# Patient Record
Sex: Female | Born: 1995 | Race: White | Hispanic: No | Smoking: Never smoker
Health system: Southern US, Community
[De-identification: ages and names within clinical notes are randomized; demographics above are authoritative.]

## PROBLEM LIST (undated history)

## (undated) DIAGNOSIS — T7840XA Allergy, unspecified, initial encounter: Secondary | ICD-10-CM

## (undated) DIAGNOSIS — G44209 Tension-type headache, unspecified, not intractable: Secondary | ICD-10-CM

## (undated) DIAGNOSIS — F429 Obsessive-compulsive disorder, unspecified: Secondary | ICD-10-CM

## (undated) DIAGNOSIS — E669 Obesity, unspecified: Secondary | ICD-10-CM

## (undated) DIAGNOSIS — F329 Major depressive disorder, single episode, unspecified: Secondary | ICD-10-CM

## (undated) DIAGNOSIS — F32A Depression, unspecified: Secondary | ICD-10-CM

## (undated) DIAGNOSIS — R569 Unspecified convulsions: Secondary | ICD-10-CM

## (undated) DIAGNOSIS — K219 Gastro-esophageal reflux disease without esophagitis: Secondary | ICD-10-CM

## (undated) DIAGNOSIS — G43009 Migraine without aura, not intractable, without status migrainosus: Secondary | ICD-10-CM

## (undated) HISTORY — DX: Unspecified convulsions: R56.9

## (undated) HISTORY — DX: Depression, unspecified: F32.A

## (undated) HISTORY — DX: Allergy, unspecified, initial encounter: T78.40XA

## (undated) HISTORY — DX: Major depressive disorder, single episode, unspecified: F32.9

## (undated) HISTORY — DX: Migraine without aura, not intractable, without status migrainosus: G43.009

## (undated) HISTORY — DX: Tension-type headache, unspecified, not intractable: G44.209

## (undated) HISTORY — DX: Obesity, unspecified: E66.9

## (undated) HISTORY — DX: Obsessive-compulsive disorder, unspecified: F42.9

## (undated) HISTORY — PX: WISDOM TOOTH EXTRACTION: SHX21

## (undated) HISTORY — DX: Gastro-esophageal reflux disease without esophagitis: K21.9

## (undated) HISTORY — PX: NO PAST SURGERIES: SHX2092

---
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
## 2017-03-03 ENCOUNTER — Other Ambulatory Visit: Payer: Self-pay | Admitting: Family

## 2017-03-04 NOTE — Telephone Encounter (Signed)
Called and LM for patient to call back and schedule an appointment.

## 2017-04-20 ENCOUNTER — Ambulatory Visit (INDEPENDENT_AMBULATORY_CARE_PROVIDER_SITE_OTHER): Payer: 59 | Admitting: Pediatrics

## 2017-04-20 ENCOUNTER — Encounter (INDEPENDENT_AMBULATORY_CARE_PROVIDER_SITE_OTHER): Payer: Self-pay | Admitting: Pediatrics

## 2017-04-20 ENCOUNTER — Telehealth (INDEPENDENT_AMBULATORY_CARE_PROVIDER_SITE_OTHER): Payer: Self-pay | Admitting: Pediatrics

## 2017-04-20 VITALS — BP 106/78 | HR 60 | Ht 64.5 in | Wt 154.6 lb

## 2017-04-20 DIAGNOSIS — G43009 Migraine without aura, not intractable, without status migrainosus: Secondary | ICD-10-CM | POA: Diagnosis not present

## 2017-04-20 DIAGNOSIS — G478 Other sleep disorders: Secondary | ICD-10-CM | POA: Diagnosis not present

## 2017-04-20 DIAGNOSIS — G44219 Episodic tension-type headache, not intractable: Secondary | ICD-10-CM

## 2017-04-20 MED ORDER — QUDEXY XR 100 MG PO CS24: 1.0000 | EXTENDED_RELEASE_CAPSULE | Freq: Every day | ORAL | 5 refills | Status: DC

## 2017-04-20 NOTE — Telephone Encounter (Signed)
I spoke with mom for about 5 minutes. I explained that reason why I thought asleep study would be in order.  It is clear that Katherine Wagner has poor sleep hygiene, but it appears that she there may be other reasons why she is tired and is taking naps all day.  Sleep study may help us understand that.  She requested that I ordered the study.  We will contact her after prior authorization has been made.

## 2017-04-20 NOTE — Progress Notes (Signed)
Patient: Katherine Wagner MRN: [id number] Sex: female DOB: [birth year]  Provider: Ellison Carwin, MD Location of Care: [facility]  Note type: Routine return visit  History of Present Illness: Referral Source: Porfirio Oar, MD History from: sibling, patient and [facility] chart Chief Complaint: Migraines  Katherine Wagner is a 21 y.o. female who returns on April 20, 2017, for the first time since August 10, 2016.  She has migraine without aura and tension-type headaches.  In the past, she had localization-related seizures.  Although Katherine Wagner has headaches every other day, she has only had one migraine since last visit.  She has not kept a headache calendar.  She tells me, however, that despite not having severe headaches, she lies down to sleep every other day in the afternoon.  She goes to bed between 10 p.m. and 3 a.m. and gets up between 8 a.m. and 12 noon.  She says that she does not sleep well and wondered if she had problems with breathing.  She also complains that she cannot hear well out of her right ear.  This has been a relatively recent problem.  Headaches can come on at anytime during the day, but typically occur later in the day, although she does awaken on occasion with headaches.  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  She has graduated from high school.  She is not certain what she wants to do and therefore has delayed the decision to start [employer].  With her one severe headache, she had nausea and vomiting, but this does not happen with the majority of her headaches.  She says that there are times that she has dreams about headaches and will wake up with one.  This does not happen very often.  Review of Systems: 12 system review was remarkable for can not hear out of right ear, one migraine since last visit, vomiting, nausea, headaches every other day; the remainder was assessed and was negative  Past Medical  History Diagnosis Date  . Migraine headache without aura    Dr. Sharene Skeans  . Obesity   . OCD (obsessive compulsive disorder)   . Seizures (HCC)   . Tension headache    episodic   Hospitalizations: No., Head Injury: No., Nervous System Infections: No., Immunizations up to date: Yes.    Patient fell hitting her head on a table at the age of 5 stitches were needed to close the wound.   Feb 07, 2012: the patient had a normal CBC and capillary glucose. She had a normal comprehensive metabolic panel and a TSH of 1.774. Her H. pylori IgG was negative.  I reviewed a CT scan of the brain without contrast on April 03, 2004, that was normal and lumbar spine films on Feb 05, 2012, that were also normal.   She had seizures that were controlled from August 2007 that were associated with drawing of her mouth and drooling, as well as unresponsive staring. 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  November 15, 2007: seizures that were under control on Trileptal, periodic headaches that did not interfere with school but did last hours and sometimes forced her to the emergency room, and emergence of obsessive compulsive behaviors where she obsessively wiped herself until she bled and caused dysuria. This was associated with counting behavior; as such she wiped herself, she  counted many different items she had to do homework in two separate places. Failure to allow her to perform these behaviors created emotional upset. She was placed on fluoxetine which seemed to help. At some point, Trileptal was tapered. I have no records showing that we performed an EEG before that took placed. Seizures have not recurred.  Headaches have been present since she was 5. She had a closed head injury requiring stitches in the right frontal region without loss of consciousness. Mild low back pain that is  not definitely interfering with her activities.   constipation and rosacea, eczema, gastroesophageal reflux disease  Birth History 6 lbs. 9 oz. infant born at full term to a 63 year old gravida 2 para 14 female. Gestation was uncomplicated Labor lasted for 8 hours Normal spontaneous vaginal delivery Nursery course was uncomplicated Growth and development was recalled as normal.  Behavior History none  Surgical History History reviewed. No pertinent surgical history.  Family History family history includes Migraines in her maternal grandmother and mother. Family history is negative for seizures, intellectual disabilities, blindness, deafness, birth defects, chromosomal disorder, or autism.  Social History  . Marital status: Single  . Years of education: 35   Occupational History  . Cashier [employer]   Social History Main Topics  . Smoking status: Never Smoker  . Smokeless tobacco: Never Used  . Alcohol use No  . Drug use: No  . 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    She graduated from [employer].     She lives with both parents and one sister, 44 yo.     She loves to hang out with friends, go to the beach and take selfies.   No Known Allergies  Physical Exam BP 106/78   Pulse 60   Ht 5' 4.5" (1.638 m)   Wt 154 lb 9.6 oz (70.1 kg)   BMI 26.13 kg/m   General: alert, well developed, well nourished, in no acute distress, blond hair, brown eyes, right handed Head: normocephalic, no dysmorphic features Ears, Nose and Throat: Otoscopic: tympanic membranes normal; pharynx: oropharynx is pink without exudates or tonsillar hypertrophy Neck: supple, full range of motion, no cranial or cervical bruits Respiratory: auscultation clear Cardiovascular: no murmurs, pulses are normal Musculoskeletal: no skeletal deformities or apparent scoliosis Skin: no rashes or neurocutaneous lesions  Neurologic Exam  Mental Status:  alert; oriented to person, place and year; knowledge is normal for age; language is normal Cranial Nerves: visual fields are full to double simultaneous stimuli; extraocular movements are full and conjugate; pupils are round reactive to light; funduscopic examination shows sharp disc margins with normal vessels; symmetric facial strength; midline tongue and uvula; air conduction is greater than bone conduction bilaterally Motor: Normal strength, tone and mass; good fine motor movements; no pronator drift Sensory: intact responses to cold, vibration, proprioception and stereognosis Coordination: good finger-to-nose, rapid repetitive alternating movements and finger apposition Gait and Station: normal gait and station: patient is able to walk on heels, toes and tandem without difficulty; balance is adequate; Romberg exam is negative; Gower response is negative Reflexes: symmetric and diminished bilaterally; no clonus; bilateral flexor plantar responses  Assessment 1. Migraine without aura and without status migrainosus, not intractable, G43.009. 2. Episodic tension-type headache, not intractable, G44.219. 3. Sleep arousal disorder, G47.8.  Discussion I am pleased that the migraines are infrequent.  I wish that she was keeping a headache calendar so that I would be certain that  the migraines are rare as she says they are.  Nonetheless, I feel fairly certain that if she was having severe headaches several times a week,that she would inform me.  She takes Qudexy, which has done a good job of controlling her migraines.  We had some problems with co-pays, which I think have been straightened out.  She is tolerating this medicine much better than she did to topiramate.  I asked her to discuss with her mother whether or not mother would agree with a polysomnogram or sleep study.  This will help us determine if she really was not sleeping well at nighttime related to periodic limb movements, sleep apnea, or  some other process.  We will likely be able to do this at [facility].  I think that she is sleeping during the day because she is not getting much sleep at nighttime.  I blame this more on sleep hygiene than some underlying organic sleep disorder.  I am also concerned that she has gained 21 pounds over the past 8 months.  She says that she is only eating a couple of meals per day.  She has gained an inch, but unfortunately most of the weight that she has gained is adipose.  If this continues, it will become a significant medical problem.  Plan I refilled her prescription for Qudexy and gave her a co-pay card.  I gave her a headache calendar and told her that if she begins to experience more migraines that I want to know and want her to start keeping record of her headaches.  I strongly urged her to take advantage of the family to membership and start to work out with her sister who is not overweight.  There is no reason to change her Qudexy at this time.  I looked in her ear and there is a lot of wax.  I tested her ear and her hearing appears to be normal.  She will return to see me in 6 months' time.  I spent 30 minutes of face-to-face time with Montina.   Medication List   Accurate as of 04/20/17 10:11 AM.      FLUoxetine 40 MG capsule Commonly known as:  PROZAC Take 40 mg by mouth daily.   ibuprofen 600 MG tablet Commonly known as:  ADVIL,MOTRIN Take 1 tablet (600 mg total) by mouth every 6 (six) hours as needed for mild pain.   medroxyPROGESTERone 150 MG/ML injection Commonly known as:  DEPO-PROVERA Inject 150 mg into the muscle every 3 (three) months.   topiramate 25 MG tablet Commonly known as:  TOPAMAX Take 3 tablets at nighttime   topiramate 25 MG tablet Commonly known as:  TOPAMAX TAKE 3 TABLETS (75 MG TOTAL) BY MOUTH AT BEDTIME.   QUDEXY XR 100 MG Cs24 Generic drug:  Topiramate ER TAKE ONE CAPSULE AT BEDTIME    The medication list was reviewed and reconciled. All  changes or newly prescribed medications were explained.  A complete medication list was provided to the patient/caregiver.  Deetta PerlaWilliam H Leng Montesdeoca MD

## 2017-04-20 NOTE — Patient Instructions (Addendum)
I will refill your Qudexy and gave you a co-pay card.  Discuss with your mother whether or not to evaluate the problems with sleep arousal with a sleep study.  It seems that are having trouble with your sleep because you are tired all day long.  This may have something to do with your every other day headaches.  If you start to have migraines, you need to keep a record of them and get up with me because we may need to adjust her Qudexy.  I'm concerned about your weight gain.  I understand that you're only eating a couple of times a day but he have to watch her choices of food any need to increase the amount of physical activity.

## 2017-04-20 NOTE — Telephone Encounter (Signed)
°  Who's calling (name and relationship to patient) :  Marylene Landngela (mom) Best contact number: [phone] Provider they see: Sharene SkeansHickling  Reason for call: Mom stated that patient was told to she may need a sleep study.  Please call to verify information.    PRESCRIPTION REFILL ONLY  Name of prescription:  Pharmacy:

## 2017-07-25 ENCOUNTER — Encounter: Payer: Self-pay | Admitting: Family Medicine

## 2017-07-25 ENCOUNTER — Ambulatory Visit (INDEPENDENT_AMBULATORY_CARE_PROVIDER_SITE_OTHER): Payer: 59 | Admitting: Family Medicine

## 2017-07-25 VITALS — BP 112/60 | HR 85 | Temp 98.4°F | Resp 16 | Ht 64.0 in | Wt 168.0 lb

## 2017-07-25 DIAGNOSIS — R200 Anesthesia of skin: Secondary | ICD-10-CM

## 2017-07-25 DIAGNOSIS — K5904 Chronic idiopathic constipation: Secondary | ICD-10-CM

## 2017-07-25 DIAGNOSIS — Z7251 High risk heterosexual behavior: Secondary | ICD-10-CM | POA: Diagnosis not present

## 2017-07-25 DIAGNOSIS — R109 Unspecified abdominal pain: Secondary | ICD-10-CM | POA: Diagnosis not present

## 2017-07-25 LAB — POCT CBC
GRANULOCYTE PERCENT: 58.9 % (ref 37–80)
HEMATOCRIT: 43.5 % (ref 37.7–47.9)
Hemoglobin: 14.3 g/dL (ref 12.2–16.2)
Lymph, poc: 3.6 — AB (ref 0.6–3.4)
MCH: 30.8 pg (ref 27–31.2)
MCHC: 33 g/dL (ref 31.8–35.4)
MCV: 93.4 fL (ref 80–97)
MID (CBC): 0.8 (ref 0–0.9)
MPV: 6.6 fL (ref 0–99.8)
PLATELET COUNT, POC: 346 10*3/uL (ref 142–424)
POC Granulocyte: 6.2 (ref 2–6.9)
POC LYMPH %: 33.6 % (ref 10–50)
POC MID %: 7.5 % (ref 0–12)
RBC: 4.66 M/uL (ref 4.04–5.48)
RDW, POC: 12.6 %
WBC: 10.6 10*3/uL — AB (ref 4.6–10.2)

## 2017-07-25 NOTE — Patient Instructions (Addendum)
Drink lots of fluids, especially water. Avoid calorie-containing beverages.  Try to eat less total calories, avoiding excessive breads, pastas, potatoes, fried and fatty foods, etc. Increase your vegetable intake, especially leafy green type vegetables. The bread that you do eat should be whole gr type breads. Eat plenty of fruits.  Try to get daily exercise such as taking a walk for 30 minutes to an hour. Initially start slow, but buildup both distance and speed.  If you toes continue to bother you try getting shoes one half size larger.  Take over-the-counter MiraLAX 1 dose daily until your bowels are moving more regularly. If they get too loose you can decrease to every other day.  If just taking the MiraLAX does not enable you to have more regular bowel movements, take over-the-counter Dulcolax in addition to that, usually about 2 pills a day, but very according to instructions on the packet.  In the event of more acute severe abdominal pain, go to the emergency room if necessary. If pains continued to persist in the fashion that they are now come back here for a recheck.  Always use protection if sexually involved, both condoms and a medication contraceptive.  Return as needed    IF you received an x-ray today, you will receive an invoice from [facility]. Please contact [facility] at [phone] with questions or concerns regarding your invoice.   IF you received labwork today, you will receive an invoice from [facility]. Please contact [facility] at [phone] with questions or concerns regarding your invoice.   Our billing staff will not be able to assist you with questions regarding bills from these companies.  You will be contacted with the lab results as soon as they are available. The fastest way to get your results is to activate your My Chart account. Instructions are located on the last page of this paperwork. If you have not heard from us regarding the  results in 2 weeks, please contact this office.

## 2017-07-25 NOTE — Progress Notes (Signed)
Patient ID: Katherine Wagner, female    DOB: Jan 07, 1996  Age: 21 y.o. MRN: [id number]  Chief Complaint  Patient presents with  . Abdominal Pain    pain comes and goes/ pain hurts worst when moving around/ x 1 month  . Possible Pregnancy    Subjective:   21 year old young lady who is here with abdominal pain. She had to leave work today because she was hurting so much. She has had these pains off and on in the last several months. No nausea or vomiting. Bowels active infrequently, with her regular schedule being about once a week. She did have a BM a day or 2 ago. She has not had diarrhea. The pain is a generalized across the lower abdomen pain. It came on fairly acutely this morning but is subsided by now. She does not take any regular medications. She is sexually involved with her boyfriend, not using any protection. Last menstrual cycle was sometime in September, date undetermined, and her periods are variable in their intensity. She used to be on Depo-Provera, birth last shot was about 6 months ago. Does not use condoms. Has not had any pregnancies.  She also is been having problems with numbness in the toes since she has been working over the last month 2. She stands a lot, wearing steel toed shoes. Normal sensation to just does not seem to return to the toes X takes shoes off.  Current allergies, medications, problem list, past/family and social histories reviewed.  Objective:  BP 112/60   Pulse 85   Temp 98.4 F (36.9 C) (Oral)   Resp 16   Ht 5\' 4"  (1.626 m)   Wt 168 lb (76.2 kg)   LMP 06/06/2017   SpO2 98%   BMI 28.84 kg/m   No major acute distress. Healthy looking young lady, a little overweight with a BMI of 28. Her abdomen has normal bowel sounds, soft without masses or tenderness. No CVA tenderness. Extremities grossly unremarkable. Pedal pulses are good. Sensory using filament test in the toes is good. Motion of the toes is good  Assessment & Plan:   Assessment: 1.  Abdominal pain, unspecified abdominal location   2. Chronic idiopathic constipation   3. Unprotected sex   4. Numbness of toes       Plan: Check a CBC and blood chemistries. The urinalysis and Price tests are normal. Suspicious for constipation related gas trapping causing the pain. Talk with her about sexual activity and about her weight.  Orders Placed This Encounter  Procedures  . Comprehensive metabolic panel  . POCT CBC    No orders of the defined types were placed in this encounter.  Results for orders placed or performed in visit on 07/25/17  POCT CBC  Result Value Ref Range   WBC 10.6 (A) 4.6 - 10.2 K/uL   Lymph, poc 3.6 (A) 0.6 - 3.4   POC LYMPH PERCENT 33.6 10 - 50 %L   MID (cbc) 0.8 0 - 0.9   POC MID % 7.5 0 - 12 %M   POC Granulocyte 6.2 2 - 6.9   Granulocyte percent 58.9 37 - 80 %G   RBC 4.66 4.04 - 5.48 M/uL   Hemoglobin 14.3 12.2 - 16.2 g/dL   HCT, POC 78.2 95.6 - 47.9 %   MCV 93.4 80 - 97 fL   MCH, POC 30.8 27 - 31.2 pg   MCHC 33.0 31.8 - 35.4 g/dL   RDW, POC 21.3 %   Platelet Count,  POC 346 142 - 424 K/uL   MPV 6.6 0 - 99.8 fL   Mild white count elevation. Abdominal pain seems most consistent with constipation still.      Patient Instructions   Drink lots of fluids, especially water. Avoid calorie-containing beverages.  Try to eat less total calories, avoiding excessive breads, pastas, potatoes, fried and fatty foods, etc. Increase your vegetable intake, especially leafy green type vegetables. The bread that you do eat should be whole gr type breads. Eat plenty of fruits.  Try to get daily exercise such as taking a walk for 30 minutes to an hour. Initially start slow, but buildup both distance and speed.  If you toes continue to bother you try getting shoes one half size larger.  Take over-the-counter MiraLAX 1 dose daily until your bowels are moving more regularly. If they get too loose you can decrease to every other day.  If just taking the  MiraLAX does not enable you to have more regular bowel movements, take over-the-counter Dulcolax in addition to that, usually about 2 pills a day, but very according to instructions on the packet.  In the event of more acute severe abdominal pain, go to the emergency room if necessary. If pains continued to persist in the fashion that they are now come back here for a recheck.  Always use protection if sexually involved, both condoms and a medication contraceptive.  Return as needed    IF you received an x-ray today, you will receive an invoice from [facility]. Please contact [facility] at [phone] with questions or concerns regarding your invoice.   IF you received labwork today, you will receive an invoice from [facility]. Please contact [facility] at [phone] with questions or concerns regarding your invoice.   Our billing staff will not be able to assist you with questions regarding bills from these companies.  You will be contacted with the lab results as soon as they are available. The fastest way to get your results is to activate your My Chart account. Instructions are located on the last page of this paperwork. If you have not heard from us regarding the results in 2 weeks, please contact this office.         No Follow-up on file.   HOPPER,DAVID, MD 07/25/2017

## 2017-07-26 LAB — COMPREHENSIVE METABOLIC PANEL
ALBUMIN: 4.7 g/dL (ref 3.5–5.5)
ALK PHOS: 75 IU/L (ref 39–117)
ALT: 25 IU/L (ref 0–32)
AST: 17 IU/L (ref 0–40)
Albumin/Globulin Ratio: 2.2 (ref 1.2–2.2)
BUN/Creatinine Ratio: 21 (ref 9–23)
BUN: 16 mg/dL (ref 6–20)
CO2: 23 mmol/L (ref 20–29)
CREATININE: 0.76 mg/dL (ref 0.57–1.00)
Calcium: 9.5 mg/dL (ref 8.7–10.2)
Chloride: 102 mmol/L (ref 96–106)
GFR, EST AFRICAN AMERICAN: 132 mL/min/{1.73_m2} (ref >59)
GFR, EST NON AFRICAN AMERICAN: 114 mL/min/{1.73_m2} (ref >59)
GLOBULIN, TOTAL: 2.1 g/dL (ref 1.5–4.5)
Glucose: 73 mg/dL (ref 65–99)
POTASSIUM: 3.7 mmol/L (ref 3.5–5.2)
Sodium: 140 mmol/L (ref 134–144)
TOTAL PROTEIN: 6.8 g/dL (ref 6.0–8.5)

## 2017-08-31 ENCOUNTER — Encounter: Payer: Self-pay | Admitting: Physician Assistant

## 2017-09-08 ENCOUNTER — Encounter: Payer: Self-pay | Admitting: Physician Assistant

## 2017-09-08 ENCOUNTER — Ambulatory Visit (INDEPENDENT_AMBULATORY_CARE_PROVIDER_SITE_OTHER): Payer: 59 | Admitting: Physician Assistant

## 2017-09-08 ENCOUNTER — Other Ambulatory Visit: Payer: Self-pay

## 2017-09-08 VITALS — HR 80 | Resp 16 | Ht 64.0 in | Wt 168.8 lb

## 2017-09-08 DIAGNOSIS — N898 Other specified noninflammatory disorders of vagina: Secondary | ICD-10-CM | POA: Diagnosis not present

## 2017-09-08 DIAGNOSIS — R300 Dysuria: Secondary | ICD-10-CM | POA: Diagnosis not present

## 2017-09-08 LAB — POCT WET + KOH PREP
Trich by wet prep: ABSENT
YEAST BY KOH: ABSENT
YEAST BY WET PREP: ABSENT

## 2017-09-08 LAB — POCT URINALYSIS DIPSTICK
Bilirubin, UA: NEGATIVE
Blood, UA: NEGATIVE
GLUCOSE UA: NEGATIVE
Ketones, UA: NEGATIVE
LEUKOCYTES UA: NEGATIVE
Nitrite, UA: NEGATIVE
Protein, UA: NEGATIVE
SPEC GRAV UA: 1.015 (ref 1.010–1.025)
UROBILINOGEN UA: 0.2 U/dL
pH, UA: 7.5 (ref 5.0–8.0)

## 2017-09-08 LAB — POCT URINE PREGNANCY: PREG TEST UR: NEGATIVE

## 2017-09-08 NOTE — Progress Notes (Signed)
09/08/2017 5:46 PM   DOB: March 10, 1996 / MRN: [id number]  SUBJECTIVE:  Katherine Wagner is a 21 y.o. female presenting for vaginal irritation x 2 weeks.  No worse or better. Has tired diflucan with success but symptoms return. Tells me she has a foul odor.  Feels that she is getting worse. She has an ob appointment tomorrow.   She has No Known Allergies.   She  has a past medical history of Migraine headache without aura, Obesity, OCD (obsessive compulsive disorder), Seizures (HCC), and Tension headache.    She  reports that  has never smoked. she has never used smokeless tobacco. She reports that she does not drink alcohol or use drugs. She  reports that she currently engages in sexual activity. The patient  has no past surgical history on file.  Her family history includes Migraines in her maternal grandmother and mother.  Review of Systems  Constitutional: Negative for chills and fever.  Genitourinary: Negative for dysuria, flank pain, frequency, hematuria and urgency.  Skin: Negative for itching and rash.  Neurological: Negative for dizziness.    The problem list and medications were reviewed and updated by myself where necessary and exist elsewhere in the encounter.   OBJECTIVE:  Pulse 80   Resp 16   Ht 5\' 4"  (1.626 m)   Wt 168 lb 12.8 oz (76.6 kg)   LMP 09/02/2017   SpO2 100%   BMI 28.97 kg/m   Physical Exam  Abdominal: Soft. Bowel sounds are normal. She exhibits no distension and no mass. There is no tenderness. There is no rebound and no guarding. Hernia confirmed negative in the right inguinal area and confirmed negative in the left inguinal area.  Genitourinary: Vagina normal and uterus normal. No labial fusion. There is no rash, tenderness, lesion or injury on the right labia. There is no rash, tenderness, lesion or injury on the left labia. Cervix exhibits no motion tenderness, no discharge and no friability. Right adnexum displays no mass, no tenderness and no  fullness. Left adnexum displays no mass, no tenderness and no fullness. No erythema, tenderness or bleeding in the vagina. No foreign body in the vagina. No signs of injury around the vagina. No vaginal discharge found.    Lymphadenopathy:       Right: No inguinal adenopathy present.       Left: No inguinal adenopathy present.    Results for orders placed or performed in visit on 09/08/17 (from the past 72 hour(s))  POCT urinalysis dipstick     Status: None   Collection Time: 09/08/17  4:47 PM  Result Value Ref Range   Color, UA yellow    Clarity, UA clear    Glucose, UA negative    Bilirubin, UA negative    Ketones, UA negative    Spec Grav, UA 1.015 1.010 - 1.025   Blood, UA negative    pH, UA 7.5 5.0 - 8.0   Protein, UA negative    Urobilinogen, UA 0.2 0.2 or 1.0 E.U./dL   Nitrite, UA negative    Leukocytes, UA Negative Negative  POCT urine pregnancy     Status: None   Collection Time: 09/08/17  4:49 PM  Result Value Ref Range   Preg Test, Ur Negative Negative  POCT Wet + KOH Prep     Status: Abnormal   Collection Time: 09/08/17  5:06 PM  Result Value Ref Range   Yeast by KOH Absent Absent   Yeast by wet prep Absent  Absent   WBC by wet prep None (A) Few   Clue Cells Wet Prep HPF POC None None   Trich by wet prep Absent Absent   Bacteria Wet Prep HPF POC Few Few   Epithelial Cells By Principal FinancialWet Pref ([facility]) Moderate (A) None, Few, Too numerous to count   RBC,UR,HPF,POC None None RBC/hpf    No results found.  ASSESSMENT AND PLAN:  Katherine Wagner was seen today for dysuria.  Diagnoses and all orders for this visit:  Vaginal irritation: : Other than some mild erythema about the cervical os her exam is normal. I can detect no odor on exam.  Will wait for the remainder of her work up to direct treatment.  -     POCT urinalysis dipstick -     POCT urine pregnancy -     HIV antibody -     RPR -     POCT Wet + KOH Prep -     GC/Chlamydia Probe Amp  Dysuria -     Cancel: Urine  Microscopic    The patient is advised to call or return to clinic if she does not see an improvement in symptoms, or to seek the care of the closest emergency department if she worsens with the above plan.   Deliah BostonMichael Clark, MHS, PA-C Primary Care at [facility] 09/08/2017 5:46 PM

## 2017-09-08 NOTE — Patient Instructions (Signed)
     IF you received an x-ray today, you will receive an invoice from [facility]. Please contact  Radiology at [phone] with questions or concerns regarding your invoice.   IF you received labwork today, you will receive an invoice from [facility]. Please contact [facility] at [phone] with questions or concerns regarding your invoice.   Our billing staff will not be able to assist you with questions regarding bills from these companies.  You will be contacted with the lab results as soon as they are available. The fastest way to get your results is to activate your My Chart account. Instructions are located on the last page of this paperwork. If you have not heard from us regarding the results in 2 weeks, please contact this office.     

## 2017-09-09 ENCOUNTER — Ambulatory Visit (INDEPENDENT_AMBULATORY_CARE_PROVIDER_SITE_OTHER): Payer: 59 | Admitting: Physician Assistant

## 2017-09-09 ENCOUNTER — Encounter: Payer: Self-pay | Admitting: Physician Assistant

## 2017-09-09 VITALS — BP 116/66 | HR 84 | Ht 64.0 in | Wt 166.0 lb

## 2017-09-09 DIAGNOSIS — K5900 Constipation, unspecified: Secondary | ICD-10-CM

## 2017-09-09 DIAGNOSIS — N898 Other specified noninflammatory disorders of vagina: Secondary | ICD-10-CM | POA: Diagnosis not present

## 2017-09-09 LAB — HIV ANTIBODY (ROUTINE TESTING W REFLEX): HIV SCREEN 4TH GENERATION: NONREACTIVE

## 2017-09-09 LAB — RPR: RPR Ser Ql: NONREACTIVE

## 2017-09-09 NOTE — Progress Notes (Signed)
Chief Complaint: "I feel like there is a bubble in my vagina"  HPI:    [name] who was referred to me by Katherine Wagner, James, MD for a complaint of "I feel like there is a bubble in my vagina".      Per review of referring physician's notes, Katherine Wagner with [facility] described that the patient has been seen in the clinic twice recently for the same complaint.  Initially patient was seen 08/12/17 and described feeling as though there was an "air bubble" in her vagina that would not pass.  At that time patient had her IUD removed as she felt like this started after IUD was placed a month prior.  It was also recommended she start magnesium citrate to try and move her bowels.  Patient then followed again in the clinic 08/29/17 with a continued sensation of a "bubble" in her vagina.  At that time, it was suspected the patient had pelvic floor spasm and possibly IBS.  She was referred to us as well as for pelvic floor physical therapy.    Today, the patient presents to clinic accompanied by her mother.  She describes that about a month ago she had an IUD placed and right after this was placed the patient started with a feeling as though there was "a bubble in my vagina that would build up".  The patient tells me that it feels like she has to "poot" but it was out of her vagina.  Patient describes that when she does pass gas from her rectum this seems to sort of relieve the pressure but it "builds right back up again".  Her mother tells me that she finds her "squatting around the house to try and push it out".  The patient has also been tearful multiple times per her mother and this is now inhibiting her life and work due to the fact that this is so "uncomfortable and annoying".  Patient says that if this is going to go on every day the rest of her life she "does not want to live like this".  Her mother is very concerned today.    Patient also describes chronic constipation having only one bowel  movement a week.  Her mother tells me she believes she has IBS "just like me".  The patient has never tried a laxative and denies a diet high in fiber.  She does tell me she drinks "a lot of water throughout the day".    Patient denies fever, chills, blood in her stool, melena, weight loss, anorexia, nausea, vomiting, heartburn, reflux or symptoms that awaken her at night.  Family history is negative for IBD.   Past Medical History:  Diagnosis Date  . Migraine headache without aura    Dr. Sharene SkeansHickling  . Obesity   . OCD (obsessive compulsive disorder)   . Seizures (HCC)   . Tension headache    episodic    History reviewed. No pertinent surgical history.  Current Outpatient Medications  Medication Sig Dispense Refill  . fluvoxaMINE (LUVOX) 50 MG tablet Take 50 mg by mouth at bedtime.    Marland Kitchen. ibuprofen (ADVIL,MOTRIN) 600 MG tablet Take 1 tablet (600 mg total) by mouth every 6 (six) hours as needed for mild pain. 30 tablet 0  . QUDEXY XR 100 MG CS24 Take 1 capsule by mouth at bedtime. 30 each 5   No current facility-administered medications for this visit.     Allergies as of 09/09/2017  . (No  Known Allergies)    Family History  Problem Relation Age of Onset  . Migraines Mother        Onset young adulthood  . Migraines Maternal Grandmother     Social History   Socioeconomic History  . Marital status: Single    Spouse name: Not on file  . Number of children: Not on file  . Years of education: Not on file  . Highest education level: Not on file  Social Needs  . Financial resource strain: Not on file  . Food insecurity - worry: Not on file  . Food insecurity - inability: Not on file  . Transportation needs - medical: Not on file  . Transportation needs - non-medical: Not on file  Occupational History  . Occupation: Lobbyist: [employer]  Tobacco Use  . Smoking status: Never Smoker  . Smokeless tobacco: Never Used  Substance and Sexual Activity  . Alcohol use: No      Alcohol/week: 0.0 oz  . Drug use: No  . Sexual activity: Yes  Other Topics Concern  . 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   She graduated from [employer].    She lives with both parents and one sister, 49 yo.    She loves to hang out with friends, go to the beach and take selfies.    Review of Systems:    Constitutional: No weight loss, fever or chills Skin: No rash Cardiovascular: No chest pain  Respiratory: No SOB  Gastrointestinal: See HPI and otherwise negative Genitourinary: No dysuria  Neurological: No headache Musculoskeletal: No new muscle or joint pain Hematologic: No bleeding  Psychiatric: No history of depression or anxiety    Physical Exam:  Vital signs: BP 116/66   Pulse 84   Ht 5\' 4"  (1.626 m)   Wt 166 lb (75.3 kg)   LMP 09/02/2017   BMI 28.49 kg/m   Constitutional:  Caucasian female appears to be in NAD, Well developed, Well nourished, alert and cooperative Head:  Normocephalic and atraumatic. Eyes:   PEERL, EOMI. No icterus. Conjunctiva pink. Ears:  Normal auditory acuity. Neck:  Supple Throat: Oral cavity and pharynx without inflammation, swelling or lesion.  Respiratory: Respirations even and unlabored. Lungs clear to auscultation bilaterally.   No wheezes, crackles, or rhonchi.  Cardiovascular: Normal S1, S2. No MRG. Regular rate and rhythm. No peripheral edema, cyanosis or pallor.  Gastrointestinal:  Soft, nondistended, nontender. No rebound or guarding. Normal bowel sounds. No appreciable masses or hepatomegaly. Rectal: External: no hemorrhoids or fissures, no discharge; Internal: no mass or lesion; Anoscopy: internal hemorrhoids, grade I, no visual signs of fistula or fistulous tract Msk:  Symmetrical without gross deformities. Without edema, no deformity or joint abnormality.  Neurologic:  Alert and  oriented x4;  grossly normal neurologically.  Skin:   Dry and intact without significant lesions or  rashes. Psychiatric: Demonstrates good judgement and reason without abnormal affect or behaviors.  RELEVANT LABS AND IMAGING: CBC    Component Value Date/Time   WBC 10.6 (A) 07/25/2017 1435   RBC 4.66 07/25/2017 1435   HGB 14.3 07/25/2017 1435   HCT 43.5 07/25/2017 1435   MCV 93.4 07/25/2017 1435   MCH 30.8 07/25/2017 1435   MCHC 33.0 07/25/2017 1435    CMP     Component Value Date/Time   NA 140 07/25/2017 1435   K 3.7 07/25/2017 1435   CL 102 07/25/2017 1435   CO2  23 07/25/2017 1435   GLUCOSE 73 07/25/2017 1435   GLUCOSE 89 02/05/2012 1808   BUN 16 07/25/2017 1435   CREATININE 0.76 07/25/2017 1435   CREATININE 0.51 02/05/2012 1808   CALCIUM 9.5 07/25/2017 1435   PROT 6.8 07/25/2017 1435   ALBUMIN 4.7 07/25/2017 1435   AST 17 07/25/2017 1435   ALT 25 07/25/2017 1435   ALKPHOS 75 07/25/2017 1435   BILITOT <0.2 07/25/2017 1435   GFRNONAA 114 07/25/2017 1435   GFRAA 132 07/25/2017 1435    Assessment: 1. 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 Constipation: Chronic for the patient, one bowel movement per week, likely related to IBS-C, discussed with patient that the pressure from her stool is likely not helping sensation she feels in her vagina  Plan: 1.  Patient was told to increase fiber in her diet to at least 25-35 g of fiber a day with use of a fiber supplement and/or fiber gummy. 2.  Recommend the patient continue her increased water intake to at least 6-8 8 ounce glasses of water per day. 3.  Recommend the patient start MiraLAX once daily. 4.  Ordered a pelvic MRI with contrast for further evaluation of possible rectovaginal fistula 5.  Discussed case with Dr. Marina GoodellPerry at the time of patient's visit.  It was recommended we schedule patient for a colonoscopy for definitive  evaluation.  Recommended this to the patient and did discuss risks, benefits, limitations and alternatives.  The patient and her mother would like to wait to schedule this procedure until results of imaging. 6.  Patient to follow in clinic per recommendations after imaging above.  Hyacinth MeekerJennifer Rylynn Schoneman, PA-C [facility] 09/09/2017, 3:13 PM  Cc: Katherine Wagner, James, MD

## 2017-09-09 NOTE — Patient Instructions (Addendum)
You have been scheduled for an MRI at 09-16-17 on 7:45 am at [facility]. Your appointment time is 8 am. Please arrive 15 minutes prior to your appointment time for registration purposes. Please make certain not to have anything to eat or drink 6 hours prior to your test. In addition, if you have any metal in your body, have a pacemaker or defibrillator, please be sure to let your ordering physician know. This test typically takes 45 minutes to 1 hour to complete. Should you need to reschedule, please call [phone] to do so.  We have given you a high fiber diet handout. Please strive to have 25-30 grams of fiber daily.    Your provider suggest that you drink more water. Try to have at least 6-8 8 oz glasses of water daily.   Please purchase the following medications over the counter and take as directed: Miralax once daily.   It has been recommended to you by your physician that you have a colonoscopy completed. Per your request, we did not schedule the procedure(s) today. Please contact our office at [phone] should you decide to have the procedure completed.

## 2017-09-09 NOTE — Progress Notes (Signed)
Case discussed with GI physician assistant. Patient reports passing air through the vagina, possibly. Agree with imaging to rule out fistula or other pelvic process. Given issues with bowel habits and this complaint, colonoscopic evaluation is quite reasonable

## 2017-09-10 LAB — GC/CHLAMYDIA PROBE AMP
Chlamydia trachomatis, NAA: NEGATIVE
Neisseria gonorrhoeae by PCR: NEGATIVE

## 2017-09-13 LAB — URINALYSIS, MICROSCOPIC ONLY

## 2017-09-14 ENCOUNTER — Telehealth: Payer: Self-pay

## 2017-09-14 NOTE — Telephone Encounter (Signed)
Please advise her to follow up with her GYN, as I have ruled out the most common causes.

## 2017-09-14 NOTE — Telephone Encounter (Signed)
Advised of normal lab results. Pt reports still having vaginal itching and white discharge for the past couple of weeks. Please advise.   Copied from CRM [id number]. Topic: General - Other >> Sep 09, 2017  3:44 PM Gerrianne ScalePayne, Angela L wrote: Reason for CRM: patient calling for lab results

## 2017-09-15 NOTE — Telephone Encounter (Signed)
Left detailed message advising pt to follow up with GYN.

## 2017-09-16 ENCOUNTER — Ambulatory Visit (HOSPITAL_COMMUNITY)
Admission: RE | Admit: 2017-09-16 | Discharge: 2017-09-16 | Disposition: A | Discharge status: Home / Self Care | Payer: 59 | Source: Ambulatory Visit | Attending: Physician Assistant | Admitting: Physician Assistant

## 2017-09-16 ENCOUNTER — Telehealth: Payer: Self-pay | Admitting: Physician Assistant

## 2017-09-16 DIAGNOSIS — N898 Other specified noninflammatory disorders of vagina: Secondary | ICD-10-CM | POA: Insufficient documentation

## 2017-09-16 DIAGNOSIS — N8312 Corpus luteum cyst of left ovary: Secondary | ICD-10-CM | POA: Insufficient documentation

## 2017-09-16 DIAGNOSIS — K5900 Constipation, unspecified: Secondary | ICD-10-CM | POA: Diagnosis present

## 2017-09-16 MED ORDER — GADOBENATE DIMEGLUMINE 529 MG/ML IV SOLN
15.0000 mL | Freq: Once | INTRAVENOUS | Status: AC | PRN
  Administered 2017-09-16: 15 mL via INTRAVENOUS

## 2017-09-16 NOTE — Telephone Encounter (Signed)
The pt has been advised of the MRI results.  She will call back to set up colon with Dr Marina GoodellPerry.  The MRI was sent to Ob-GYN

## 2017-10-05 ENCOUNTER — Encounter: Payer: Self-pay | Admitting: Internal Medicine

## 2017-10-11 ENCOUNTER — Telehealth (INDEPENDENT_AMBULATORY_CARE_PROVIDER_SITE_OTHER): Payer: Self-pay | Admitting: Pediatrics

## 2017-10-11 NOTE — Telephone Encounter (Signed)
°  Who's calling (name and relationship to patient) : Marylene Landngela (Mother) Best contact number: [phone] Provider they see: Dr. Sharene SkeansHickling  Reason for call: Per mom, refill on Qudexy cannot be given. Needs PA. Per mom, [facility] faxed over request to our office yesterday.

## 2017-10-11 NOTE — Telephone Encounter (Signed)
Spoke with mom to inform her that the PA was approved this morning. I also let her know that there will be a co pay card upfront for them just in case there is still a little for them to pay

## 2017-10-18 ENCOUNTER — Other Ambulatory Visit: Payer: Self-pay

## 2017-10-18 ENCOUNTER — Encounter: Payer: Self-pay | Admitting: Internal Medicine

## 2017-10-18 ENCOUNTER — Ambulatory Visit (AMBULATORY_SURGERY_CENTER): Payer: Self-pay

## 2017-10-18 VITALS — Ht 64.5 in | Wt 167.6 lb

## 2017-10-18 DIAGNOSIS — N899 Noninflammatory disorder of vagina, unspecified: Secondary | ICD-10-CM

## 2017-10-18 MED ORDER — NA SULFATE-K SULFATE-MG SULF 17.5-3.13-1.6 GM/177ML PO SOLN: 1.0000 | Freq: Once | ORAL | 0 refills | Status: AC

## 2017-10-18 NOTE — Progress Notes (Signed)
Denies allergies to eggs or soy products. Denies complication of anesthesia or sedation. Denies use of weight loss medication. Denies use of O2.   Emmi instructions declined.  

## 2017-10-20 ENCOUNTER — Telehealth: Payer: Self-pay | Admitting: Internal Medicine

## 2017-10-20 NOTE — Telephone Encounter (Signed)
Spoke to patient regarding Suprep coupon. I told the patient that I did have a pay no more than 50.00 coupon and she was welcome to have it. Patient states her co-pay is 45 dollar's so she does not need the coupon.   Katherine DaneNancy Sky Borboa, LPN ( [facility] )

## 2017-10-24 ENCOUNTER — Encounter: Payer: 59 | Admitting: Internal Medicine

## 2017-10-24 ENCOUNTER — Telehealth: Payer: Self-pay | Admitting: *Deleted

## 2017-10-24 ENCOUNTER — Telehealth: Payer: Self-pay | Admitting: Internal Medicine

## 2017-10-24 NOTE — Telephone Encounter (Signed)
I spoke to her mother, and she said that the patient had been vomiting all night.  She stated that the prep was difficult. Stated that she (Mom) didn't use that prep and that it was much easier.  Mom asked if patient could have another prep.  I told her that it was up to the patient's doctor, and that it would be discussed at the patient's pre-visit.  Mom stated that the patient would call back to reschedule when she feels better.  The doctor was never called last night regarding the vomiting.  Mom stated that she "didn't know that she could call."

## 2017-10-24 NOTE — Telephone Encounter (Signed)
Forwarded to previsit so they can make a note of the prep when the patient calls to reschedule

## 2017-10-24 NOTE — Telephone Encounter (Signed)
[place] prep ( we should have samples). Hard candy to suck on. Zofran 4 mg 20 min prior to each prep session

## 2017-10-26 NOTE — Telephone Encounter (Signed)
Added prep for next procedure date to edit notes section  Katherine Wagner

## 2017-11-10 ENCOUNTER — Other Ambulatory Visit (INDEPENDENT_AMBULATORY_CARE_PROVIDER_SITE_OTHER): Payer: Self-pay | Admitting: Pediatrics

## 2017-11-10 DIAGNOSIS — G43009 Migraine without aura, not intractable, without status migrainosus: Secondary | ICD-10-CM

## 2017-12-12 ENCOUNTER — Other Ambulatory Visit (INDEPENDENT_AMBULATORY_CARE_PROVIDER_SITE_OTHER): Payer: Self-pay | Admitting: Pediatrics

## 2017-12-12 DIAGNOSIS — G43009 Migraine without aura, not intractable, without status migrainosus: Secondary | ICD-10-CM

## 2017-12-15 ENCOUNTER — Encounter (HOSPITAL_COMMUNITY): Payer: Self-pay | Admitting: Emergency Medicine

## 2017-12-15 ENCOUNTER — Ambulatory Visit: Payer: Self-pay

## 2017-12-15 ENCOUNTER — Emergency Department (HOSPITAL_COMMUNITY)
Admission: EM | Admit: 2017-12-15 | Discharge: 2017-12-16 | Disposition: A | Discharge status: Home / Self Care | Payer: 59 | Attending: Emergency Medicine | Admitting: Emergency Medicine

## 2017-12-15 ENCOUNTER — Emergency Department (HOSPITAL_COMMUNITY): Payer: 59

## 2017-12-15 DIAGNOSIS — Y9323 Activity, snow (alpine) (downhill) skiing, snow boarding, sledding, tobogganing and snow tubing: Secondary | ICD-10-CM | POA: Diagnosis not present

## 2017-12-15 DIAGNOSIS — Y999 Unspecified external cause status: Secondary | ICD-10-CM | POA: Diagnosis not present

## 2017-12-15 DIAGNOSIS — Z79899 Other long term (current) drug therapy: Secondary | ICD-10-CM | POA: Insufficient documentation

## 2017-12-15 DIAGNOSIS — F0781 Postconcussional syndrome: Secondary | ICD-10-CM

## 2017-12-15 DIAGNOSIS — Y929 Unspecified place or not applicable: Secondary | ICD-10-CM | POA: Insufficient documentation

## 2017-12-15 DIAGNOSIS — S0990XA Unspecified injury of head, initial encounter: Secondary | ICD-10-CM | POA: Insufficient documentation

## 2017-12-15 NOTE — Telephone Encounter (Signed)
rec'd phone call from pt. To report she hit her head while snowboarding about 2 weeks ago.  Reported hitting "back" of head x 2, and that one of the hits was very hard.  Her head hit the ground that was snow covered.  Reported she did not lose consciousness, but laid there for awhile after the head injury.  Denied nausea or vomiting.  Denied vision loss or blurred vision @ that time. Stated she felt dizzy and drunk that night "for awhile", and went to bed.  Reported she did not get checked out after the injury.  Reported about 3 days ago, she started feeling drunk again.  Reported she isn't really functioning at her normal.  Also, reported she has had difficulty chewing and swallowing the past 2-3 days.  Denied feeling weakness in her extremities, but stated "I don't feel as strong as I usually am."  Able to answer questions appropriately to determine alertness to person, place, and time.   Advised that she should go to the ER for evaluation today.  The pt. stated she wants to wait and speak to her mother, and may go tomorrow.  Strongly encouraged pt. to go to ER tonight with her present symptoms.       Also reported, she takes Migraine medication, and medication for OCD.  Reported her OCD medication was increased recently, but this happened prior to the snowboarding accident.  Reported she went back to her previous dose, because she did not like the way it made her feel.   Phone call to pt's mother.  Advised of phone call by the pt. and of recommendation to go to the hospital ER.  Mother verb. Understanding.                     Reason for Disposition . Dangerous injury (e.g., MVA, diving, trampoline, contact sports, fall > 10 feet or 3 meters) or severe blow from hard object (e.g., golf club or baseball bat)    [place] boarding accident 2 weeks ago; hit back of head hard x 2 on ground covered with snow; recent feeling like she is drunk, and difficulty chewing and swallowing.  Answer Assessment - Initial  Assessment Questions 1. MECHANISM: "How did the injury happen?" For falls, ask: "What height did you fall from?" and "What surface did you fall against?"      Was snowboarding and hit back of head x 2 ; once very hard impact 2. ONSET: "When did the injury happen?" (Minutes or hours ago)      2 weeks ago  3. NEUROLOGIC SYMPTOMS: "Was there any loss of consciousness?" "Are there any other neurological symptoms?"      Did not lose consciousness.  Denied loss / blurring of vision; c/o dizziness that lasted awhile 4. MENTAL STATUS: "Does the person know who he is, who you are, and where he is?"      Alert to person, place, and time   5. LOCATION: "What part of the head was hit?"      Back of head 6. SCALP APPEARANCE: "What does the scalp look like? Is it bleeding now?" If so, ask: "Is it difficult to stop?"     Denied bruising , swelling, or bleeding   7. SIZE: For cuts, bruises, or swelling, ask: "How large is it?" (e.g., inches or centimeters)      Denied any injury to the scalp 8. PAIN: "Is there any pain?" If so, ask: "How bad is it?"  (e.g.,  Scale 1-10; or mild, moderate, severe)     Denies any headache at this time.  9. TETANUS: For any breaks in the skin, ask: "When was the last tetanus booster?"     N/a 10. OTHER SYMPTOMS: "Do you have any other symptoms?" (e.g., neck pain, vomiting)       Felt drunk that night; c/o dizziness for several hours; denied nausea/ vomiting; c/o difficulty with chewing and swallowing ; having difficulty managing what is in her mouth for 2 days; drunk feeling reoccured about 3 days ago.     11. PREGNANCY: "Is there any chance you are pregnant?" "When was your last menstrual period?"      No; LMP- on period currently.  Protocols used: HEAD INJURY-A-AH

## 2017-12-15 NOTE — ED Triage Notes (Signed)
Pt reports she hit her head snowboarding 1 week ago. 3 days ago she noted dizziness that is constant, she feels tired and drowsy, also states she is having trouble chewing. Also reports trouble focusing both eyes.

## 2017-12-15 NOTE — ED Provider Notes (Signed)
Patient placed in Quick Look pathway, seen and evaluated   Chief Complaint: Headache.    HPI:   Pt hit her head a week ago while snowboarding.  Pt complains of being dizzy and drowsy.  Pt complains of trouble focusing  ROS: Review of Systems  All other systems reviewed and are negative.   Physical Exam:   Gen: No distress  Neuro: Awake and Alert  Skin: Warm    Focused Exam: cn 2-12 no abnormality  Mouth clear,  tmj normal, normal dental lignment.   Initiation of care has begun. The patient has been counseled on the process, plan, and necessity for staying for the completion/evaluation, and the remainder of the medical screening examination   Osie CheeksSofia, Clevester Helzer K, PA-C 12/15/17 1806    Doug SouJacubowitz, Sam, MD 12/15/17 2333

## 2017-12-16 ENCOUNTER — Telehealth: Payer: Self-pay | Admitting: Family Medicine

## 2017-12-16 NOTE — ED Provider Notes (Signed)
[facility]ENC[facility] Provider Note   CSN: [id number] Arrival date & time: 12/15/17  1717     History   Chief Complaint Chief Complaint  Patient presents with  . Dizziness  . Head Injury    HPI Katherine Wagner is a 22 y.o. female.  Patient presents to the ED with a chief complaint of head injury.  She states that she was snowboarding a little over a week ago and fell and hit her head.  She did not pass out.  She states that since then she has had some difficulty focusing and some intermittent dizziness.  She denies numbness, weakness, or tingling.  She has taken OTC meds with little relief.   The history is provided by the patient. No language interpreter was used.    Past Medical History:  Diagnosis Date  . Allergy   . Depression   . GERD (gastroesophageal reflux disease)   . Migraine headache without aura    Dr. Sharene SkeansHickling  . Obesity   . OCD (obsessive compulsive disorder)   . Seizures (HCC)   . Tension headache    episodic    Patient Active Problem List   Diagnosis Date Noted  . 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 Problems with learning 11/25/2015  . Migraine without aura 04/02/2013  . Episodic tension type headache 04/02/2013  . Obsessive-compulsive disorders 04/02/2013  . Variants of migraine, not elsewhere classified, without mention of intractable migraine without mention of status migrainosus 04/02/2013  . 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 Partial epilepsy with impairment of consciousness (HCC) 04/02/2013  . OCD (obsessive compulsive disorder)   . Obesity     No past surgical history on file.  OB History    No data available       Home Medications    Prior to Admission medications   Medication Sig Start Date End Date Taking? Authorizing Provider  ibuprofen (ADVIL,MOTRIN) 600 MG tablet Take 1  tablet (600 mg total) by mouth every 6 (six) hours as needed for mild pain. 05/12/14   Marcellina MillinGaley, Timothy, MD  QUDEXY XR 100 MG CS24 sprinkle capsule TAKE ONE CAPSULE BY MOUTH AT BEDTIME 12/12/17   Deetta PerlaHickling, William H, MD    Family History Family History  Problem Relation Age of Onset  . Migraines Mother        Onset young adulthood  . Migraines Maternal Grandmother   . Colon cancer Neg Hx   . Esophageal cancer Neg Hx   . Liver cancer Neg Hx   . Pancreatic cancer Neg Hx   . Rectal cancer Neg Hx   . Stomach cancer Neg Hx     Social History Social History   Tobacco Use  . Smoking status: Never Smoker  . Smokeless tobacco: Never Used  Substance Use Topics  . Alcohol use: No    Alcohol/week: 0.0 oz  . Drug use: No     Allergies   Patient has no known allergies.   Review of Systems Review of Systems  All other systems reviewed and are negative.    Physical Exam Updated Vital Signs BP 119/82   Pulse (!) 59   Temp 98.6 F (37 C) (Oral)   Resp 18   Ht 5\' 4"  (1.626 m)   Wt 65.8 kg (145 lb)   LMP 12/14/2017   SpO2 100%   BMI 24.89 kg/m   Physical Exam  Constitutional: She  is oriented to person, place, and time. She appears well-developed and well-nourished.  HENT:  Head: Normocephalic and atraumatic.  Right Ear: External ear normal.  Left Ear: External ear normal.  Eyes: Conjunctivae and EOM are normal. Pupils are equal, round, and reactive to light.  Neck: Normal range of motion. Neck supple.  No pain with neck flexion, no meningismus  Cardiovascular: Normal rate, regular rhythm and normal heart sounds. Exam reveals no gallop and no friction rub.  No murmur heard. Pulmonary/Chest: Effort normal and breath sounds normal. No respiratory distress. She has no wheezes. She has no rales. She exhibits no tenderness.  Abdominal: Soft. She exhibits no distension and no mass. There is no tenderness. There is no rebound and no guarding.  Musculoskeletal: Normal range of  motion. She exhibits no edema or tenderness.  Normal gait.  Neurological: She is alert and oriented to person, place, and time. She has normal reflexes.  CN 3-12 intact, normal finger to nose, no pronator drift, sensation and strength intact bilaterally.  Skin: Skin is warm and dry.  Psychiatric: She has a normal mood and affect. Her behavior is normal. Judgment and thought content normal.  Nursing note and vitals reviewed.    ED Treatments / Results  Labs (all labs ordered are listed, but only abnormal results are displayed) Labs Reviewed - No data to display  EKG  EKG Interpretation None       Radiology Ct Head Wo Contrast  Result Date: 12/15/2017 CLINICAL DATA:  22 y/o F; head injury 2 weeks ago. Dizziness beginning 3 days ago with pain. EXAM: CT HEAD WITHOUT CONTRAST TECHNIQUE: Contiguous axial images were obtained from the base of the skull through the vertex without intravenous contrast. COMPARISON:  04/03/2004 CT head. FINDINGS: Brain: No evidence of acute infarction, hemorrhage, hydrocephalus, extra-axial collection or mass lesion/mass effect. 3 mm low-lying cerebellar tonsils normal rounded shape. Vascular: No hyperdense vessel or unexpected calcification. Skull: Normal. Negative for fracture or focal lesion. Sinuses/Orbits: No acute finding. Other: None. IMPRESSION: No findings of traumatic brain injury. 3 mm low-lying cerebellar tonsils. Otherwise unremarkable CT of head. Electronically Signed   By: Mitzi Hansen M.D.   On: 12/15/2017 18:51    Procedures Procedures (including critical care time)  Medications Ordered in ED Medications - No data to display   Initial Impression / Assessment and Plan / ED Course  I have reviewed the triage vital signs and the nursing notes.  Pertinent labs & imaging results that were available during my care of the patient were reviewed by me and considered in my medical decision making (see chart for details).     Patient  with post-concussive symptoms.  CT ordered in triage is negative.  VSS.  Neuro exam is normal.    DC with concussion clinic follow-up.  Return precautions given.  Final Clinical Impressions(s) / ED Diagnoses   Final diagnoses:  Post concussive syndrome    ED Discharge Orders    None       Roxy Horseman, PA-C 12/16/17 Vesta Mixer, April, MD 12/16/17 [phone]

## 2017-12-16 NOTE — ED Notes (Signed)
Signature pad unavailable to pt at time of discharge pt denied any further questions or needs.

## 2017-12-16 NOTE — ED Notes (Signed)
ED Provider at bedside. 

## 2017-12-16 NOTE — Telephone Encounter (Signed)
Called the patient's mother and informed her that Sports Medicine closed at 12:00 today but someone would be in touch with her on Monday. She said that she has an appointment on Monday so it may be best to reach her on her cell phone [phone]) and you can leave a detailed message.   Copied from CRM [id number]. Topic: Appointment Scheduling - Scheduling Inquiry for Clinic >> Dec 16, 2017  3:51 PM Crist InfanteHarrald, Kathy J wrote: Reason for CRM:  mom angela calling for the pt (mom is on DPR) because pt was discharged from the ED today with the dx of a concussion. Pt hit her head while snowboarding. AVS instructed pt to make an appt with Dr Katrinka BlazingSmith ASAP.   Mom wants to know why the ED doctor would recommend Dr Katrinka BlazingSmith and what could he do for her?  Is there special testing Dr Katrinka BlazingSmith does on the head? Pt does have a neurologist. Mom wants to know would this be the same,  could he clear her for return to work? Mom would like a call back.  Dr Katrinka BlazingSmith does not have any appointments for new pt or otherwise for about 2 weeks. Pt has to be cleared to go back to work. Can you advise?

## 2017-12-16 NOTE — Telephone Encounter (Signed)
Patient seen in ED last night, noted.

## 2017-12-19 ENCOUNTER — Telehealth: Payer: Self-pay

## 2017-12-19 NOTE — Telephone Encounter (Signed)
Spoke with patient's mother. Patient suffered a head injury 2 weeks ago when she was snowboarding. She did not have a loss of consciousness but has continued to have headaches, fatigue, and nausea. Patient did try returning to work which is a [employer] position. Her symptoms worsened and has been out of work since. Recommended for patient to rest as much as possible and not perform any activities that increase her symptoms until we see her on Thursday. Told mother if patient's symptoms do increase that she will need to go into the ER before her appointment with us. Mother voices understanding.

## 2017-12-19 NOTE — Telephone Encounter (Signed)
Left message for patient to call back if she would like an appointment in concussion clinic.

## 2017-12-21 NOTE — Progress Notes (Signed)
Subjective:   I, Wilford Grist, am serving as a scribe for Dr. Antoine Primas, DO.  Chief Complaint: Katherine Wagner, DOB: 1996/09/02, is a 22 y.o. female who presents for head injury sustained on 12/03/2017. She was snowboarding and was not wearing a helmet. Hit her head multiple times. Patient did develop a headache but thought nothing of it. Patient states that her sister thought patient was acting different and may have a concussion. Patient has had headaches intermittently since injury and does have a history of migraines. Patient returned to work for 10 days when she began to have an increase in her symptoms including nausea and dizziness. She was seen in ER. CT in chart. Patient also has history of seizures.   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   History of Present Illness:    Concussion Self-Reported Symptom Score Symptoms rated on a scale 1-6, in last 24 hours  Headache: 4   Nausea: 3  Vomiting: 0  Balance Difficulty: 2  Dizziness: 5  Fatigue: 6  Trouble Falling Asleep: 4  Sleep More Than Usual: 6  Sleep Less Than Usual: 0  Daytime Drowsiness:6  Photophobia: 5  Phonophobia: 4  Irritability: 5  Sadness: 0  Nervousness: 0  Feeling More Emotional: 0  Numbness or Tingling: 0  Feeling Slowed Down: 6  Feeling Mentally Foggy: 6  Difficulty Concentrating: 6  Difficulty Remembering: 0  Visual Problems: 0, does note it's hard to concentrate     Total Symptom Score: 71  Review of Systems: Pertinent items are noted in HPI.  Review of History: Past Medical History:  Past Medical History:  Diagnosis Date  . Allergy   . Depression   . GERD (gastroesophageal reflux disease)   . Migraine headache without aura    Dr. Sharene Skeans  . Obesity   . OCD (obsessive compulsive disorder)   . Seizures (HCC)   . Tension headache    episodic    Past Surgical History:  has no past surgical history on file. Family History:  family history includes Migraines in her maternal grandmother and mother. Social History:  reports that she has never smoked. She has never used smokeless tobacco. She reports that she does not drink alcohol or use drugs. Current Medications: has a current medication list which includes the following prescription(s): fluvoxamine and ibuprofen. Allergies: has No Known Allergies.  Objective:    Physical Examination Vitals:   12/22/17 1100  BP: 116/70  Pulse: 66  SpO2: 98%   General appearance: alert, appears stated age and cooperative Head: Normocephalic, without obvious abnormality, atraumatic Eyes: conjunctivae/corneas clear. PERRL, does have vertical and lateral nystagmus with right gaze. Fundi benign. Sclera anicteric. Lungs: clear to auscultation bilaterally and percussion Heart: regular rate and rhythm, S1, S2 normal, no murmur, click, rub or gallop Neurologic: CN 2-12 normal.  Sensation to pain, touch, and proprioception normal.  DTRs  normal in upper and lower extremities. No pathologic reflexes. Neg rhomberg, modified rhomberg, pronator drift, tandem gait, finger-to-nose; see post-concussion vestibular and oculomotor testing in chart Psychiatric: Oriented X3, intact recent and remote memory, judgement and insight, significant flat affect  Concussion testing performed today:  I spent 65 minutes with patient discussing test and results including review of history and patient chart and  integration of patient data, interpretation of standardized test results and clinical data, clinical decision making, treatment planning and report,and interactive feedback to the patient with all of patients questions  answered.  Patient does show significant decrease in test scores.   Neurocognitive testing (ImPACT):   Post #1:   Verbal Memory Composite  58 (1%)   Visual Memory Composite  52 (8%)   Visual Motor Speed Composite  20.02(<1%)   Reaction Time Composite  .91 (<1%)   Cognitive  Efficiency Index  .03    Vestibular Screening:       Headache  Dizziness  Smooth Pursuits y n  H. Saccades y n  V. Saccades y n  H. VOR y n  V. VOR y n  Biomedical scientistVisual Motor Sensitivity y n      Convergence: 0cm  Right eye deviates n      Assessment:    No diagnosis found.  Katherine Wagner presents with the following concussion subtypes. [x] Cognitive [] Cervical [] Vestibular [] Ocular [] Migraine [] Anxiety/Mood   Plan:   Action/Discussion: Reviewed diagnosis, management options, expected outcomes, and the reasons for scheduled and emergent follow-up. Questions were adequately answered. Patient expressed verbal understanding and agreement with the following plan.     After Visit Summary printed out and provided to patient as appropriate.

## 2017-12-22 ENCOUNTER — Encounter: Payer: Self-pay | Admitting: Family Medicine

## 2017-12-22 ENCOUNTER — Telehealth: Payer: Self-pay | Admitting: Family Medicine

## 2017-12-22 ENCOUNTER — Ambulatory Visit (INDEPENDENT_AMBULATORY_CARE_PROVIDER_SITE_OTHER): Payer: 59 | Admitting: Family Medicine

## 2017-12-22 DIAGNOSIS — S060X9A Concussion with loss of consciousness of unspecified duration, initial encounter: Secondary | ICD-10-CM | POA: Insufficient documentation

## 2017-12-22 DIAGNOSIS — S060XAA Concussion with loss of consciousness status unknown, initial encounter: Secondary | ICD-10-CM | POA: Insufficient documentation

## 2017-12-22 DIAGNOSIS — S060X0A Concussion without loss of consciousness, initial encounter: Secondary | ICD-10-CM | POA: Diagnosis not present

## 2017-12-22 DIAGNOSIS — Q048 Other specified congenital malformations of brain: Secondary | ICD-10-CM | POA: Diagnosis not present

## 2017-12-22 NOTE — Telephone Encounter (Signed)
Spoke with patient's mother who wanted to know what it meant that her daughter's brain was lower on one side than the other. Did this come from the fall or has it always been like this? Also if an MRI is indicated, what is it going to show? Please advise.

## 2017-12-22 NOTE — Assessment & Plan Note (Signed)
Patient may have had a concussion.  Work environment is very mild but likely exacerbation.  Discussed over-the-counter medications and icing regimen.  Discussed avoiding certain activities and will hold out of work for 1 week.  Patient will decrease the amount of stimulants.  Follow-up with me again in 1 week patient does have many comorbidities and will make this difficult to assess appropriately.

## 2017-12-22 NOTE — Telephone Encounter (Signed)
This encounter was created in error - please disregard.

## 2017-12-22 NOTE — Patient Instructions (Addendum)
Good to see you  I do think you have a concussion Fish oil 3 grams daily  Vitamin D 2000 IU daily  CoQ10 400mg  daily  We will hld you out for the next month  See me again in 1 week

## 2017-12-22 NOTE — Telephone Encounter (Signed)
Cerebellar ectopia.  Tell her can be normal.  It is when the cerebellar tonsils are near the foramen magnum that could cause headaches. It is not large but may need to watch it.  If not better we will get MRI.

## 2017-12-22 NOTE — Telephone Encounter (Signed)
Message from Stra[place]Kelly Moton, [place] sent at 12/22/2017 12:41 PM EDT   Summary: Call Back   Patients mother is calling to speak with someone about her daughter and the appointment she had today With Dr.Smith. Due to the fact that the daughter has a concussion and can't remember what was said. She can be reached at [phone] or [phone]        Call History    Type Contact Phone User  12/22/2017 12:37 PM Phone (Incoming) Bradd Canaryeeler,Angela (Mother) [phone] Rexene Edison(H) Moton, [place], [place]

## 2017-12-22 NOTE — Assessment & Plan Note (Signed)
Noted on CT scan.  Patient continues to be symptomatic will consider further workup with MRI

## 2017-12-23 NOTE — Telephone Encounter (Signed)
Called patient's mother to relay Dr. Michaelle CopasSmith's message about the cerebellar ectopia. Mother inquired about treatment for condition and I recommended that she attend appointment to discuss Dr. Michaelle CopasSmith's treatment options that day. Mother voices understanding.

## 2017-12-28 NOTE — Progress Notes (Signed)
Subjective:     Chief Complaint: Katherine Wagner, DOB: 1996/05/12, is a 22 y.o. female who is following up for head injury. She has been out of work and states that she has had slight improvement. She notes floaters in her vision and a blurriness with watching tv. Her headaches do not occur daily but she is still having them. She gets a headache with having to concentrate. Patient feels very fatigued despite all of the rest that she has been getting.    Injury date : 12/03/2017 Visit #: 2  Previous imagine.   History of Present Illness:    Concussion Self-Reported Symptom Score Symptoms rated on a scale 1-6, in last 24 hours  Headache: 3   Nausea:0  Vomiting: 0  Balance Difficulty: 0   Dizziness: 0  Fatigue: 6  Trouble Falling Asleep: 3  Sleep More Than Usual: 0  Sleep Less Than Usual: 6  Daytime Drowsiness: 6  Photophobia: 4  Phonophobia: 3  Irritability: 2  Sadness: 0  Nervousness: 0  Feeling More Emotional: 0  Numbness or Tingling: 0  Feeling Slowed Down: 5  Feeling Mentally Foggy: 5  Difficulty Concentrating: 6  Difficulty Remembering: 5  Visual Problems: 4    Total Symptom Score: 57 Previous Symptom Score: 71  Review of Systems: Pertinent items are noted in HPI.  Review of History: Past Medical History:  Past Medical History:  Diagnosis Date  . Allergy   . Depression   . GERD (gastroesophageal reflux disease)   . Migraine headache without aura    Dr. Gaynell Face  . Obesity   . OCD (obsessive compulsive disorder)   . Seizures ([facility])   . Tension headache    episodic    Past Surgical History:  has no past surgical history on file. Family History: family history includes Migraines in her maternal grandmother and mother. Social History:  reports that she has never smoked. She has never used smokeless tobacco. She reports that she does not drink alcohol or use drugs. Current Medications: has a current medication list which includes the following  prescription(s): fluvoxamine and ibuprofen. Allergies: has No Known Allergies.  Objective:    Physical Examination Vitals:   12/29/17 1241  BP: 108/62  Pulse: 72  SpO2: 99%   General appearance: alert, appears stated age and cooperative Head: Normocephalic, without obvious abnormality, atraumatic Eyes: conjunctivae/corneas clear. PERRL, EOM's intact. Fundi benign. Sclera anicteric. Lungs: clear to auscultation bilaterally and percussion Heart: regular rate and rhythm, S1, S2 normal, no murmur, click, rub or gallop Neurologic: CN 2-12 normal.  Sensation to pain, touch, and proprioception normal.  DTRs  normal in upper and lower extremities. No pathologic reflexes. Neg rhomberg, modified rhomberg, pronator drift, tandem gait, finger-to-nose; see post-concussion vestibular and oculomotor testing in chart Psychiatric: Oriented X3, intact recent and remote memory, judgement and insight, normal mood and affect  Concussion testing performed today:  I spent 55 minutes with patient discussing test and results including review of history and patient chart and  integration of patient data, interpretation of standardized test results and clinical data, clinical decision making, treatment planning and report,and interactive feedback to the patient with all of patients questions answered.    Neurocognitive testing (ImPACT):   Post #1:    Verbal Memory Composite  54 (<1%)   Visual Memory Composite  58 (16%)   Visual Motor Speed Composite  21.23 (<1%)   Reaction Time Composite  .98 (<1%)   Cognitive Efficiency Index  .01  Additional testing performed today: Significant difficulty still with serial sevens as well as word recall.   Assessment:    Polyarthralgia - Plan: Sed Rate (ESR), Angiotensin converting enzyme, ANA, Calcium, ionized, CBC with Differential/Platelet, Comprehensive metabolic panel, C-reactive protein, Cyclic citrul peptide antibody, IgG, IBC panel, Rheumatoid factor,  Sedimentation rate, TSH, VITAMIN D 25 Hydroxy (Vit-D Deficiency, Fractures), PTH, Intact and Calcium, Uric acid  Concussion without loss of consciousness, subsequent encounter  Cerebellar tonsillar ectopia (HCC)  Jaqulyn A Moure presents with the following concussion subtypes. [x] Cognitive    Plan:   Action/Discussion: Reviewed diagnosis, management options, expected outcomes, and the reasons for scheduled and emergent follow-up. Questions were adequately answered. Patient expressed verbal understanding and agreement with the following plan.

## 2017-12-29 ENCOUNTER — Ambulatory Visit (INDEPENDENT_AMBULATORY_CARE_PROVIDER_SITE_OTHER): Payer: 59 | Admitting: Family Medicine

## 2017-12-29 ENCOUNTER — Encounter: Payer: Self-pay | Admitting: Family Medicine

## 2017-12-29 ENCOUNTER — Other Ambulatory Visit (INDEPENDENT_AMBULATORY_CARE_PROVIDER_SITE_OTHER): Payer: 59

## 2017-12-29 VITALS — BP 108/62 | HR 72 | Ht 64.0 in | Wt 144.0 lb

## 2017-12-29 DIAGNOSIS — S060X0D Concussion without loss of consciousness, subsequent encounter: Secondary | ICD-10-CM | POA: Diagnosis not present

## 2017-12-29 DIAGNOSIS — M2550 Pain in unspecified joint: Secondary | ICD-10-CM | POA: Diagnosis not present

## 2017-12-29 DIAGNOSIS — Q048 Other specified congenital malformations of brain: Secondary | ICD-10-CM

## 2017-12-29 LAB — COMPREHENSIVE METABOLIC PANEL
ALT: 22 U/L (ref 0–35)
AST: 19 U/L (ref 0–37)
Albumin: 4.5 g/dL (ref 3.5–5.2)
Alkaline Phosphatase: 67 U/L (ref 39–117)
BILIRUBIN TOTAL: 0.5 mg/dL (ref 0.2–1.2)
BUN: 16 mg/dL (ref 6–23)
CALCIUM: 9.9 mg/dL (ref 8.4–10.5)
CO2: 25 meq/L (ref 19–32)
Chloride: 106 mEq/L (ref 96–112)
Creatinine, Ser: 0.83 mg/dL (ref 0.40–1.20)
GFR: 92.97 mL/min (ref >60.00)
Glucose, Bld: 107 mg/dL — ABNORMAL HIGH (ref 70–99)
Potassium: 3.9 mEq/L (ref 3.5–5.1)
Sodium: 139 mEq/L (ref 135–145)
Total Protein: 7.2 g/dL (ref 6.0–8.3)

## 2017-12-29 LAB — CBC WITH DIFFERENTIAL/PLATELET
BASOS ABS: 0.1 10*3/uL (ref 0.0–0.1)
Basophils Relative: 0.7 % (ref 0.0–3.0)
Eosinophils Absolute: 0.4 10*3/uL (ref 0.0–0.7)
Eosinophils Relative: 4.4 % (ref 0.0–5.0)
HEMATOCRIT: 42.7 % (ref 36.0–46.0)
Hemoglobin: 14.7 g/dL (ref 12.0–15.0)
LYMPHS PCT: 24.3 % (ref 12.0–46.0)
Lymphs Abs: 2 10*3/uL (ref 0.7–4.0)
MCHC: 34.4 g/dL (ref 30.0–36.0)
MCV: 93.7 fl (ref 78.0–100.0)
MONOS PCT: 8.6 % (ref 3.0–12.0)
Monocytes Absolute: 0.7 10*3/uL (ref 0.1–1.0)
NEUTROS ABS: 5 10*3/uL (ref 1.4–7.7)
Neutrophils Relative %: 62 % (ref 43.0–77.0)
Platelets: 318 10*3/uL (ref 150.0–400.0)
RBC: 4.56 Mil/uL (ref 3.87–5.11)
RDW: 12.7 % (ref 11.5–14.6)
WBC: 8.1 10*3/uL (ref 4.5–10.5)

## 2017-12-29 LAB — URIC ACID: Uric Acid, Serum: 5.5 mg/dL (ref 2.4–7.0)

## 2017-12-29 LAB — SEDIMENTATION RATE: SED RATE: 6 mm/h (ref 0–20)

## 2017-12-29 LAB — TSH: TSH: 2.18 u[IU]/mL (ref 0.35–5.50)

## 2017-12-29 LAB — IBC PANEL
Iron: 103 ug/dL (ref 42–145)
SATURATION RATIOS: 26 % (ref 20.0–50.0)
TRANSFERRIN: 283 mg/dL (ref 212.0–360.0)

## 2017-12-29 LAB — VITAMIN D 25 HYDROXY (VIT D DEFICIENCY, FRACTURES): VITD: 44.09 ng/mL (ref 30.00–100.00)

## 2017-12-29 LAB — C-REACTIVE PROTEIN: CRP: 0.1 mg/dL — ABNORMAL LOW (ref 0.5–20.0)

## 2017-12-29 NOTE — Assessment & Plan Note (Signed)
Testing shows minimal improvement.  Very mild improvement as stated.  Patient still somewhat symptomatic.  Laboratory workup to rule out any other potential causes of patient's chronic headaches.  Patient has had difficulty with migraines as well as potential epileptic seizures.  We discussed still with the cerebellar ectopia the possible MRI could be beneficial.  Patient has declined that at this moment.  We discussed laboratory workup which she would do.  Patient will restart work on Monday with 4-hour shifts.  Patient will follow up with me again in 7-10 days for further evaluation and treatment.

## 2017-12-29 NOTE — Assessment & Plan Note (Signed)
Once again discussed with patient.  Could be 1 of the causes of patient's chronic headaches.  We discussed the possibility of an MRI which patient declined at the moment.  Will discuss with her mother and decide.  Patient will follow up with me again in 7-10 days.  Worsening headaches knows to seek medical attention immediately.

## 2017-12-29 NOTE — Patient Instructions (Signed)
Good to see you  Fish oil 3 grams daily  Vitamin D 2000 IU daily  CoQ10 400mg  daily  We will get labs today  Talk about the MRI but I think we can wait  4 hour shifts starting Sunday  See me again in Thursday, Friday or maybe next Monday

## 2018-01-01 LAB — CYCLIC CITRUL PEPTIDE ANTIBODY, IGG

## 2018-01-01 LAB — ANA: ANA: NEGATIVE

## 2018-01-01 LAB — PTH, INTACT AND CALCIUM
Calcium: 10.4 mg/dL — ABNORMAL HIGH (ref 8.6–10.2)
PTH: 14 pg/mL (ref 14–64)

## 2018-01-01 LAB — RHEUMATOID FACTOR

## 2018-01-01 LAB — CALCIUM, IONIZED: CALCIUM ION: 5.5 mg/dL (ref 4.8–5.6)

## 2018-01-01 LAB — ANGIOTENSIN CONVERTING ENZYME: ANGIOTENSIN-CONVERTING ENZYME: 42 U/L (ref 9–67)

## 2018-01-05 ENCOUNTER — Ambulatory Visit (INDEPENDENT_AMBULATORY_CARE_PROVIDER_SITE_OTHER): Payer: 59 | Admitting: Family Medicine

## 2018-01-05 ENCOUNTER — Encounter: Payer: Self-pay | Admitting: Family Medicine

## 2018-01-05 DIAGNOSIS — Q048 Other specified congenital malformations of brain: Secondary | ICD-10-CM | POA: Diagnosis not present

## 2018-01-05 DIAGNOSIS — G44329 Chronic post-traumatic headache, not intractable: Secondary | ICD-10-CM

## 2018-01-05 DIAGNOSIS — I771 Stricture of artery: Secondary | ICD-10-CM

## 2018-01-05 NOTE — Assessment & Plan Note (Signed)
Patient does have an abnormal CT scan.  Continues to be symptomatic.  Patient does have a history of migraines and this could be a potential variant.  Patient has seen a neurologist for this previously.  Has had localization and epilepsy syndromes as well.  This makes it very difficult.  I feel that the concussion is completely resolved at this time but with apparent continuing to have the headaches I feel an MR angiogram of the head and neck and an MRI of the brain would be beneficial at this time.  If all normal then I would consider further evaluation by neurology again.  Patient and mother made clear that if any significant worsening symptoms they should seek medical attention immediately in the emergency room.  Patient will continue with the vitamins.  We will hold patient out of work until imaging is done.

## 2018-01-05 NOTE — Progress Notes (Signed)
Tawana ScaleZach Smith D.O. [facility] [address] [place], [place] [address] Phone: [phone] Subjective:      CC: concussion follow up   ONG:[id number]:Subjective  Katherine Wagner is a 22 y.o. female coming in with complaint of head injury. She has been working for 4 hour shifts but feels that working made her symptoms increase. Her mother is here today and states that she does not want patient working until she is symptom free. Patient states that she feels like she has a headache and also free pressure in her temples and behind her eyes. She also notes that she feels shaky. Symptom score 80.  This is significantly increased from patient's previous exam.  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  Denies as much neck pain and feels that the headaches are more on the anterior aspect.  Feels like she is now having a throbbing sensation and can feel a heartbeat in her head.  Denies any visual changes but states that light and sound is still aggravating.  Patient is now accompanied for the first time with her mother who seems concerned      Past Medical History:  Diagnosis Date  . Allergy   . Depression   . GERD (gastroesophageal reflux disease)   . Migraine headache without aura    Dr. Sharene SkeansHickling  . Obesity   . OCD (obsessive compulsive disorder)   . Seizures (HCC)   . Tension headache    episodic   No past surgical history on file. Social History   Socioeconomic History  . Marital status: Single    Spouse name: Not on file  . Number of children: Not on file  . Years of education: Not on file  . Highest education level: Not on file  Occupational History  . Occupation: LobbyistCashier    Employer: [employer]  Social Needs  . Financial resource strain: Not on file  . Food insecurity:    Worry: Not on file    Inability: Not on file  . Transportation needs:    Medical: Not on file    Non-medical: Not on file  Tobacco Use  . Smoking status: Never  Smoker  . Smokeless tobacco: Never Used  Substance and Sexual Activity  . Alcohol use: No    Alcohol/week: 0.0 oz  . Drug use: No  . Sexual activity: Yes  Lifestyle  . Physical activity:    Days per week: Not on file    Minutes per session: Not on file  . Stress: Not on file  Relationships  . Social connections:    Talks on phone: Not on file    Gets together: Not on file    Attends religious service: Not on file    Active member of club or organization: Not on file    Attends meetings of clubs or organizations: Not on file    Relationship status: Not on file  Other Topics Concern  . Not on file  Social History Narrative   [name] is a high [employer] graduate.   She graduated from [employer].    She lives with both parents and one sister, 20 yo.    She loves to hang out with friends, go to the beach and take selfies.   No Known Allergies Family History  Problem Relation Age of Onset  . Migraines Mother        Onset young adulthood  . Migraines Maternal Grandmother   .  Colon cancer Neg Hx   . Esophageal cancer Neg Hx   . Liver cancer Neg Hx   . Pancreatic cancer Neg Hx   . Rectal cancer Neg Hx   . Stomach cancer Neg Hx      Past medical history, social, surgical and family history all reviewed in electronic medical record.  No pertanent information unless stated regarding to the chief complaint.   Review of Systems:Review of systems updated and as accurate as of 01/05/18  No  visual changes, nausea, vomiting, diarrhea, constipation, dizziness, abdominal pain, skin rash, fevers, chills, night sweats, weight loss, swollen lymph nodes, body aches, joint swelling,  chest pain, shortness of breath, mood changes.  Positive headaches  Objective  Blood pressure 102/74, pulse 80, height 5\' 4"  (1.626 m), weight 145 lb (65.8 kg), last menstrual period 12/14/2017, SpO2 98 %. Systems examined below as of 01/05/18   General: No apparent distress alert and oriented x3 mood and affect  normal, dressed appropriately.  HEENT: Pupils equal, extraocular movements intact  Respiratory: Patient's speak in full sentences and does not appear short of breath  Cardiovascular: No lower extremity edema, non tender, no erythema  Skin: Warm dry intact with no signs of infection or rash on extremities or on axial skeleton.  Abdomen: Soft nontender  Neuro: Cranial nerves II through XII are intact, neurovascularly intact in all extremities with 2+ DTRs and 2+ pulses.  Lymph: No lymphadenopathy of posterior or anterior cervical chain or axillae bilaterally.  Gait normal with good balance and coordination.  MSK:  Non tender with full range of motion and good stability and symmetric strength and tone of shoulders, elbows, wrist, hip, knee and ankles bilaterally.   Neck: Inspection mild loss of lordosis. No palpable stepoffs. Negative Spurling's maneuver. Tightness but full range of motion Grip strength and sensation normal in bilateral hands Strength good C4 to T1 distribution No sensory change to C4 to T1 Negative Hoffman sign bilaterally Reflexes normal Positive tightness of the trapezius bilaterally.    Impression and Recommendations:     This case required medical decision making of moderate complexity.      Note: This dictation was prepared with Dragon dictation along with smaller phrase technology. Any transcriptional errors that result from this process are unintentional.

## 2018-01-05 NOTE — Patient Instructions (Signed)
Good to see you  I am sorry you are still having trouble We will get the MRI of everything and see  We will call you and discuss next steps or if you prefer you cna make an appointment 1-2 days after the MRI and we can discuss in person.  Keep you out of work another 2 weeks and we will see

## 2018-01-08 NOTE — Progress Notes (Deleted)
Katherine Wagner [address] [place], [place] [address] Phone: [phone] Subjective:    I'm seeing this patient by the request  of:    CC:   [id number]  Katherine Wagner is a 22 y.o. female coming in with complaint of ***  Onset-  Location Duration-  Character- Aggravating factors- Reliving factors-  Therapies tried-  Severity-     Past Medical History:  Diagnosis Date  . Allergy   . Depression   . GERD (gastroesophageal reflux disease)   . Migraine headache without aura    Dr. Sharene Skeans  . Obesity   . OCD (obsessive compulsive disorder)   . Seizures (HCC)   . Tension headache    episodic   No past surgical history on file. Social History   Socioeconomic History  . Marital status: Single    Spouse name: Not on file  . Number of children: Not on file  . Years of education: Not on file  . Highest education level: Not on file  Occupational History  . Occupation: Lobbyist: [employer]  Social Needs  . Financial resource strain: Not on file  . Food insecurity:    Worry: Not on file    Inability: Not on file  . Transportation needs:    Medical: Not on file    Non-medical: Not on file  Tobacco Use  . Smoking status: Never Smoker  . Smokeless tobacco: Never Used  Substance and Sexual Activity  . Alcohol use: No    Alcohol/week: 0.0 oz  . Drug use: No  . Sexual activity: Yes  Lifestyle  . Physical activity:    Days per week: Not on file    Minutes per session: Not on file  . Stress: Not on file  Relationships  . Social connections:    Talks on phone: Not on file    Gets together: Not on file    Attends religious service: Not on file    Active member of club or organization: Not on file    Attends meetings of clubs or organizations: Not on file    Relationship status: Not on file  Other Topics Concern  . 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   She graduated from [employer].    She lives with both parents and one sister, 64 yo.    She loves to hang out with friends, go to the beach and take selfies.   No Known Allergies Family History  Problem Relation Age of Onset  . Migraines Mother        Onset young adulthood  . Migraines Maternal Grandmother   . Colon cancer Neg Hx   . Esophageal cancer Neg Hx   . Liver cancer Neg Hx   . Pancreatic cancer Neg Hx   . Rectal cancer Neg Hx   . Stomach cancer Neg Hx      Past medical history, social, surgical and family history all reviewed in electronic medical record.  No pertanent information unless stated regarding to the chief complaint.   Review of Systems:Review of systems updated and as accurate as of 01/08/18  No headache, visual changes, nausea, vomiting, diarrhea, constipation, dizziness, abdominal pain, skin rash, fevers, chills, night sweats, weight loss, swollen lymph nodes, body aches, joint swelling, muscle aches, chest pain, shortness of breath, mood changes.   Objective  Last menstrual period 12/14/2017. Systems examined below as of 01/08/18  General: No apparent distress alert and oriented x3 mood and affect normal, dressed appropriately.  HEENT: Pupils equal, extraocular movements intact  Respiratory: Patient's speak in full sentences and does not appear short of breath  Cardiovascular: No lower extremity edema, non tender, no erythema  Skin: Warm dry intact with no signs of infection or rash on extremities or on axial skeleton.  Abdomen: Soft nontender  Neuro: Cranial nerves II through XII are intact, neurovascularly intact in all extremities with 2+ DTRs and 2+ pulses.  Lymph: No lymphadenopathy of posterior or anterior cervical chain or axillae bilaterally.  Gait normal with good balance and coordination.  MSK:  Non tender with full range of motion and good stability and symmetric strength and tone of shoulders, elbows, wrist, hip, knee and ankles bilaterally.     Impression and  Recommendations:     This case required medical decision making of moderate complexity.      Note: This dictation was prepared with Dragon dictation along with smaller phrase technology. Any transcriptional errors that result from this process are unintentional.

## 2018-01-09 ENCOUNTER — Ambulatory Visit: Payer: 59 | Admitting: Family Medicine

## 2018-01-12 ENCOUNTER — Telehealth: Payer: Self-pay | Admitting: Family Medicine

## 2018-01-12 NOTE — Telephone Encounter (Unsigned)
Copied from CRM [id number]. Topic: Quick Communication - See Telephone Encounter >> Jan 12, 2018  1:39 PM Raquel SarnaHayes, Teresa G wrote: Pt's mother checking on the status of pt's MRI that was supposed to be ordered by Dr. Michiel SitesZ. Smith for pt's concussion. Please call mother to let her know the status asap.

## 2018-01-16 NOTE — Telephone Encounter (Signed)
Left message for mother to call me back to discuss MRI scheduling.

## 2018-01-16 NOTE — Telephone Encounter (Signed)
Spoke with mother who is going to call [facility] to get patient scheduled now that 2 of the MRI's have been approved.

## 2018-01-18 ENCOUNTER — Other Ambulatory Visit (INDEPENDENT_AMBULATORY_CARE_PROVIDER_SITE_OTHER): Payer: Self-pay | Admitting: Pediatrics

## 2018-01-18 ENCOUNTER — Telehealth (INDEPENDENT_AMBULATORY_CARE_PROVIDER_SITE_OTHER): Payer: Self-pay | Admitting: Pediatrics

## 2018-01-18 DIAGNOSIS — G43009 Migraine without aura, not intractable, without status migrainosus: Secondary | ICD-10-CM

## 2018-01-18 MED ORDER — TOPIRAMATE ER 100 MG PO SPRINKLE CAP24: EXTENDED_RELEASE_CAPSULE | ORAL | 0 refills | Status: DC

## 2018-01-18 NOTE — Telephone Encounter (Signed)
Please call mother on her cell which is [phone]. Rufina FalcoEmily M Hull

## 2018-01-18 NOTE — Telephone Encounter (Signed)
°  Who's calling (name and relationship to patient) : Marylene Landngela, mother Best contact number: [phone] Provider they see: Sharene SkeansHickling Reason for call:     PRESCRIPTION REFILL ONLY  Name of prescription: Qudexy XR 100MG   Pharmacy: [facility] in [place]

## 2018-01-18 NOTE — Telephone Encounter (Signed)
Rx has been electronically sent to the pharmacy 

## 2018-01-25 ENCOUNTER — Other Ambulatory Visit: Payer: 59

## 2018-01-25 ENCOUNTER — Ambulatory Visit
Admission: RE | Admit: 2018-01-25 | Discharge: 2018-01-25 | Disposition: A | Discharge status: Home / Self Care | Payer: 59 | Source: Ambulatory Visit | Attending: Family Medicine | Admitting: Family Medicine

## 2018-01-25 DIAGNOSIS — G44329 Chronic post-traumatic headache, not intractable: Secondary | ICD-10-CM

## 2018-01-26 NOTE — Progress Notes (Signed)
Spoke with patient per results. She is going to speak with her mom to see if they would like to come back in for an appointment.

## 2018-02-08 ENCOUNTER — Ambulatory Visit (INDEPENDENT_AMBULATORY_CARE_PROVIDER_SITE_OTHER): Payer: 59 | Admitting: Pediatrics

## 2018-02-10 ENCOUNTER — Ambulatory Visit (INDEPENDENT_AMBULATORY_CARE_PROVIDER_SITE_OTHER): Payer: 59 | Admitting: Pediatrics

## 2018-02-13 ENCOUNTER — Other Ambulatory Visit (INDEPENDENT_AMBULATORY_CARE_PROVIDER_SITE_OTHER): Payer: Self-pay | Admitting: Pediatrics

## 2018-02-13 DIAGNOSIS — G43009 Migraine without aura, not intractable, without status migrainosus: Secondary | ICD-10-CM

## 2018-02-24 ENCOUNTER — Ambulatory Visit (INDEPENDENT_AMBULATORY_CARE_PROVIDER_SITE_OTHER): Payer: 59 | Admitting: Pediatrics

## 2018-03-01 ENCOUNTER — Ambulatory Visit (INDEPENDENT_AMBULATORY_CARE_PROVIDER_SITE_OTHER): Payer: 59 | Admitting: Pediatrics

## 2018-03-01 ENCOUNTER — Encounter (INDEPENDENT_AMBULATORY_CARE_PROVIDER_SITE_OTHER): Payer: Self-pay | Admitting: Pediatrics

## 2018-03-01 VITALS — BP 100/72 | HR 60 | Ht 64.5 in | Wt 135.4 lb

## 2018-03-01 DIAGNOSIS — G43009 Migraine without aura, not intractable, without status migrainosus: Secondary | ICD-10-CM

## 2018-03-01 DIAGNOSIS — G44219 Episodic tension-type headache, not intractable: Secondary | ICD-10-CM | POA: Diagnosis not present

## 2018-03-01 DIAGNOSIS — Z72821 Inadequate sleep hygiene: Secondary | ICD-10-CM

## 2018-03-01 DIAGNOSIS — Q048 Other specified congenital malformations of brain: Secondary | ICD-10-CM | POA: Diagnosis not present

## 2018-03-01 MED ORDER — QUDEXY XR 100 MG PO CS24: EXTENDED_RELEASE_CAPSULE | ORAL | 5 refills | Status: DC

## 2018-03-01 NOTE — Progress Notes (Deleted)
Patient: Katherine Wagner MRN: [id number] Sex: female DOB: [birth year]  Provider: Ellison Carwin, MD Location of Care: [facility]  Note type: Routine return visit  History of Present Illness: Referral Source: Dahlia Client, MD History from: mother, patient and [facility] chart Chief Complaint: Migraines  Katherine Wagner is a 22 y.o. female who ***  Review of Systems: A complete review of systems was remarkable for , all other systems reviewed and negative.  Past Medical History Past Medical History:  Diagnosis Date  . Allergy   . Depression   . GERD (gastroesophageal reflux disease)   . Migraine headache without aura    Dr. Sharene Skeans  . Obesity   . OCD (obsessive compulsive disorder)   . Seizures (HCC)   . Tension headache    episodic   Hospitalizations: No., Head Injury: No., Nervous System Infections: No., Immunizations up to date: Yes.    ***  Birth History *** lbs. *** oz. infant born at *** weeks gestational age to a *** year old g *** p *** *** *** *** female. Gestation was {Complicated/Uncomplicated Pregnancy:20185} Mother received {CN Delivery analgesics:210120005}  {method of delivery:313099} Nursery Course was {Complicated/Uncomplicated:20316} Growth and Development was {cn recall:210120004}  Behavior History {Symptoms; behavioral problems:18883}  Surgical History No past surgical history on file.  Family History family history includes Migraines in her maternal grandmother and mother. Family history is negative for migraines, seizures, intellectual disabilities, blindness, deafness, birth defects, chromosomal disorder, or autism.  Social History Social History   Socioeconomic History  . Marital status: Single    Spouse name: Not on file  . Number of children: Not on file  . Years of education: Not on file  . Highest education level: Not on file  Occupational History  . Occupation: Lobbyist: [employer]  Social Needs    . Financial resource strain: Not on file  . Food insecurity:    Worry: Not on file    Inability: Not on file  . Transportation needs:    Medical: Not on file    Non-medical: Not on file  Tobacco Use  . Smoking status: Never Smoker  . Smokeless tobacco: Never Used  Substance and Sexual Activity  . Alcohol use: No    Alcohol/week: 0.0 oz  . Drug use: No  . Sexual activity: Yes  Lifestyle  . Physical activity:    Days per week: Not on file    Minutes per session: Not on file  . Stress: Not on file  Relationships  . Social connections:    Talks on phone: Not on file    Gets together: Not on file    Attends religious service: Not on file    Active member of club or organization: Not on file    Attends meetings of clubs or organizations: Not on file    Relationship status: Not on file  Other Topics Concern  . 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   She graduated from [employer].    She lives with both parents and one sister, 44 yo.    She loves to hang out with friends, go to the beach and take selfies.     Allergies No Known Allergies  Physical Exam There were no vitals taken for this visit.  ***   Assessment   Discussion   Plan  Allergies as of 03/01/2018   No Known Allergies     Medication List  Accurate as of 03/01/18 11:37 AM. Always use your most recent med list.          fluvoxaMINE 100 MG tablet Commonly known as:  LUVOX Take 100 mg by mouth at bedtime.   ibuprofen 600 MG tablet Commonly known as:  ADVIL,MOTRIN Take 1 tablet (600 mg total) by mouth every 6 (six) hours as needed for mild pain.   topiramate ER Cs24 sprinkle capsule Commonly known as:  QUDEXY XR TAKE 1 CAPSULE BY MOUTH EVERYDAY AT BEDTIME   QUDEXY XR Cs24 sprinkle capsule Generic drug:  topiramate ER TAKE 1 CAPSULE BY MOUTH EVERYDAY AT BEDTIME       The medication list was reviewed and reconciled. All changes or newly prescribed  medications were explained.  A complete medication list was provided to the patient/caregiver.  Deetta Perla MD

## 2018-03-01 NOTE — Progress Notes (Signed)
Patient: Katherine Wagner MRN: [id number] Sex: female DOB: 04-24-1996  Provider: Ellison Carwin, MD Location of Care: [facility]  Note type: Routine return visit  History of Present Illness: Referral Source: Porfirio Oar, MD History from: mother, patient and [facility] chart Chief Complaint: headaches  Katherine Wagner is a 22 y.o. female who returns for follow up of headaches. She has a h/o migraines without aura and tension-type headaches. In the past, she had localization-related seizures. Last seen in Neurology clinic on 04/20/17, at which time her migraines were less frequent and she was continued on Qudexy qhs. Since then, Katherine Wagner suffered a concussion in March 2019 while snowboarding without a helmet.   She was seen in the [facility] ED on 12/15/17 around 1 week after the injury for continued issues focusing and intermittent dizziness. Head CT showed no traumatic brain injury, and noted "3 mm low-lying cerebellar tonsils." As she continued to have symptoms, she followed up with her PCP who obtained MRI/MRA on 01/25/18 which showed "mild cerebellar tonsillar ectopia of 4-5 mm without frank Chiari malformation, " and was otherwise normal. Her mother is very concerned about the findings on the imaging and about any effect the concussion could have had on the ectopia/future implications.   Katherine Wagner has continued to have headaches frequently since the concussion- denies any migraines but reports a pressure-like pain across the front of her head. Sleep and ibuprofen help resolve the pain. 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 She has made a concerted effort to do more exercise and has lost weight this year. Also worried that she has nearly constant right eyelid twitching. Denies associated vision changes or pain.   Review of Systems: A complete review of systems was unremarkable. no migraines, she  feels like her head is full of pressure build up, dizziness, blurry vision sometimes  Past Medical History Diagnosis Date  . Allergy   . Depression   . GERD (gastroesophageal reflux disease)   . Migraine headache without aura    Dr. Sharene Skeans  . Obesity   . OCD (obsessive compulsive disorder)   . Seizures (HCC)   . Tension headache    episodic   Hospitalizations: No., Head Injury: Yes.  concussion as discussed above Nervous System Infections: No., Immunizations up to date: Yes.    Birth History 6 lbs. 9 oz. infant born at full term to a 41 year old gravida 2 para 67 female. Gestation was uncomplicated Labor lasted for 8 hours Normal spontaneous vaginal delivery Nursery course was uncomplicated Growth and development was recalled as normal  Behavior History none  Surgical History History reviewed. No pertinent surgical history.  Family History family history includes Migraines in her maternal grandmother and mother. Family history is negative for seizures, intellectual disabilities, blindness, deafness, birth defects, chromosomal disorder, or autism.  Social History Socioeconomic History  . Marital status: Single  . Years of education: 88  . Highest education level: High school  Occupational History  . Occupation: Lobbyist: [employer]  Social Needs  . Financial resource strain: Not on file  . Food insecurity:    Worry: Not on file    Inability: Not on file  . Transportation needs:    Medical: Not on file    Non-medical: Not on file  Tobacco Use  . Smoking status: Never Smoker  . Smokeless tobacco: Never Used  Substance and  Sexual Activity  . Alcohol use: No    Alcohol/week: 0.0 oz  . Drug use: No  . 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    She graduated from [employer].     She lives with both parents and one sister, 64 yo.     She loves to hang out with friends, go to the beach and take selfies.    No Known Allergies  Physical Exam BP 100/72   Pulse 60   Ht 5' 4.5" (1.638 m)   Wt 135 lb 6.4 oz (61.4 kg)   BMI 22.88 kg/m   General: alert, well developed, well nourished, in no acute distress, blond hair, brown eyes, right handed Head: normocephalic, no dysmorphic features Ears, Nose and Throat: Otoscopic: tympanic membranes normal; pharynx: oropharynx is pink without exudates or tonsillar hypertrophy Neck: supple, full range of motion, no cranial or cervical bruits Respiratory: auscultation clear Cardiovascular: no murmurs, pulses are normal Musculoskeletal: no skeletal deformities or apparent scoliosis Skin: no rashes or neurocutaneous lesions  Neurologic Exam  Mental Status: alert; oriented to person, place and year; knowledge is normal for age; language is normal Cranial Nerves: visual fields are full to double simultaneous stimuli; extraocular movements are full and conjugate; pupils are round reactive to light; funduscopic examination shows sharp disc margins with normal vessels; symmetric facial strength; midline tongue and uvula; air conduction is greater than bone conduction bilaterally Motor: Normal strength, tone and mass; good fine motor movements; no pronator drift Sensory: intact responses to cold, vibration, proprioception and stereognosis Coordination: good finger-to-nose, rapid repetitive alternating movements and finger apposition Gait and Station: normal gait and station: patient is able to walk on heels, toes and tandem without difficulty; balance is adequate; Romberg exam is negative; Gower response is negative Reflexes: symmetric and diminished bilaterally; no clonus; bilateral flexor plantar responses  Assessment 1. Episodic tension-type headache, not intractable 2. Migraine without aura and without status migrainosus, not intractable 3. Cerebellar tonsillar ectopia (HCC) 4. Poor sleep hygiene  Discussion While Faithann has had significant improvement  in her migraines, she is having nearly daily tension-type headaches which are likely exacerbated by her shift work schedule and chronic sleep deprivation. Discussed at length today that there are no medications which will prevent this type of headache, and lifestyle modifications will likely be needed to make a difference in her symptoms. We also reviewed her imaging and reassured her and family that the incidentally noted ectopia is likely a congenital finding and should have no implications for future headaches.   Plan Katherine Wagner will continue Qudexy for migraine control, which was refilled today. Strongly encouraged at least 8 hours of sleep daily, and if that cannot be managed then at least increasing the number of hours she is resting. Additionally advised to keep up adequate fluid intake and good nutrition.    Medication List    Accurate as of 03/01/18  3:29 PM.      fluvoxaMINE 100 MG tablet Commonly known as:  LUVOX Take 100 mg by mouth at bedtime.   ibuprofen 600 MG tablet Commonly known as:  ADVIL,MOTRIN Take 1 tablet (600 mg total) by mouth every 6 (six) hours as needed for mild pain.   QUDEXY XR Cs24 sprinkle capsule Generic drug:  topiramate ER TAKE 1 CAPSULE BY MOUTH EVERYDAY AT BEDTIME    The medication list was reviewed and reconciled. All changes or newly prescribed medications were explained.  A complete medication list was  provided to the patient/caregiver.  Resident: Rolland Bimler, MD [facility], PGY-3  25 minutes of face-to-face time was spent with Katherine Wagner and her, more than half of it in consultation.  We discussed her head injury and the need for protection when she is snowboarding or on any device where she is not on her own 2 feet.  I am not certain whether we are still been dealing with a posttraumatic headache which is certainly possible.  It is interesting that while migraines have subsided a chronic daily headache is now present.  I supervised Dr.  Alinda Money.  I performed physical examination, participated in history taking, and guided decision making.  I shared my findings and recommendations with Dr. Alinda Money and the family.  Katherine Perla MD

## 2018-03-01 NOTE — Patient Instructions (Signed)
I am pleased that you are not having migraines.  I think that the headaches that she have her mixture of with leftover from your concussion, not getting enough sleep, 12 hours of noisy job which is leading to a chronic tension type headache.  Limit the amount of over-the-counter medication that you take so that you do not develop rebound headaches.  I would continue to take the topiramate for now.  The only way that I see this improving is if you can find a job where you are not having to work all night can get regular sleep hydrate yourself well and continue your physical exercise.

## 2018-09-21 ENCOUNTER — Other Ambulatory Visit (INDEPENDENT_AMBULATORY_CARE_PROVIDER_SITE_OTHER): Payer: Self-pay | Admitting: Pediatrics

## 2018-09-21 DIAGNOSIS — G43009 Migraine without aura, not intractable, without status migrainosus: Secondary | ICD-10-CM

## 2018-10-03 ENCOUNTER — Ambulatory Visit (INDEPENDENT_AMBULATORY_CARE_PROVIDER_SITE_OTHER): Payer: 59 | Admitting: Emergency Medicine

## 2018-10-03 ENCOUNTER — Encounter: Payer: Self-pay | Admitting: Emergency Medicine

## 2018-10-03 ENCOUNTER — Other Ambulatory Visit: Payer: Self-pay

## 2018-10-03 VITALS — BP 96/64 | HR 67 | Temp 98.6°F | Resp 16 | Ht 63.5 in | Wt 116.4 lb

## 2018-10-03 DIAGNOSIS — R350 Frequency of micturition: Secondary | ICD-10-CM | POA: Diagnosis not present

## 2018-10-03 DIAGNOSIS — R300 Dysuria: Secondary | ICD-10-CM | POA: Insufficient documentation

## 2018-10-03 DIAGNOSIS — N390 Urinary tract infection, site not specified: Secondary | ICD-10-CM | POA: Insufficient documentation

## 2018-10-03 LAB — POC MICROSCOPIC URINALYSIS (UMFC): Mucus: ABSENT

## 2018-10-03 LAB — POCT URINALYSIS DIP (MANUAL ENTRY)
BILIRUBIN UA: NEGATIVE
BILIRUBIN UA: NEGATIVE mg/dL
Glucose, UA: NEGATIVE mg/dL
LEUKOCYTES UA: NEGATIVE
NITRITE UA: NEGATIVE
PH UA: 7 (ref 5.0–8.0)
Protein Ur, POC: NEGATIVE mg/dL
Spec Grav, UA: 1.015 (ref 1.010–1.025)
Urobilinogen, UA: 4 E.U./dL — AB

## 2018-10-03 LAB — POCT URINE PREGNANCY: Preg Test, Ur: NEGATIVE

## 2018-10-03 MED ORDER — SULFAMETHOXAZOLE-TRIMETHOPRIM 800-160 MG PO TABS: 1.0000 | ORAL_TABLET | Freq: Two times a day (BID) | ORAL | 0 refills | Status: AC

## 2018-10-03 MED ORDER — PHENAZOPYRIDINE HCL 200 MG PO TABS: 200.0000 mg | ORAL_TABLET | Freq: Three times a day (TID) | ORAL | 0 refills | Status: DC | PRN

## 2018-10-03 NOTE — Progress Notes (Signed)
Katherine Wagner 22 y.o.   Chief Complaint  Patient presents with  . Urinary Frequency    HISTORY OF PRESENT ILLNESS: This is a 22 y.o. female complaining of UTI symptoms for 2 to 3 weeks.  Recently had blood work done and took antibiotics for several days.  Has some bladder pressure with urinary frequency.  Denies nausea or vomiting.  Denies fever or chills.  Denies flank pain.  No other significant symptoms.  States that she gets UTIs frequently. Urinary Tract Infection   This is a new problem. The current episode started 1 to 4 weeks ago. The problem occurs intermittently. The problem has been waxing and waning. The quality of the pain is described as burning. The pain is at a severity of 3/10. The pain is mild. There has been no fever. She is sexually active. There is no history of pyelonephritis. Associated symptoms include frequency and urgency. Pertinent negatives include no chills, discharge, flank pain, hematuria, nausea, possible pregnancy, sweats or vomiting. She has tried antibiotics for the symptoms. The treatment provided mild relief. Her past medical history is significant for recurrent UTIs. There is no history of catheterization, kidney stones or a urological procedure.     Prior to Admission medications   Medication Sig Start Date End Date Taking? Authorizing Provider  fluvoxaMINE (LUVOX) 100 MG tablet Take 100 mg by mouth at bedtime.   Yes [provider]  ibuprofen (ADVIL,MOTRIN) 600 MG tablet Take 1 tablet (600 mg total) by mouth every 6 (six) hours as needed for mild pain. 05/12/14  Yes Marcellina MillinGaley, Timothy, MD  QUDEXY XR CS24 sprinkle capsule TAKE 1 CAPSULE BY MOUTH EVERYDAY AT BEDTIME 09/21/18  Yes Hickling, Deanna ArtisWilliam H, MD    No Known Allergies  Patient Active Problem List   Diagnosis Date Noted  . Poor sleep hygiene 03/01/2018  . Concussion 12/22/2017  . 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 Problems with learning  11/25/2015  . Migraine without aura 04/02/2013  . Episodic tension type headache 04/02/2013  . Obsessive-compulsive disorders 04/02/2013  . Variants of migraine, not elsewhere classified, without mention of intractable migraine without mention of status migrainosus 04/02/2013  . 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 Partial epilepsy with impairment of consciousness (HCC) 04/02/2013  . OCD (obsessive compulsive disorder)   . Obesity     Past Medical History:  Diagnosis Date  . Allergy   . Depression   . GERD (gastroesophageal reflux disease)   . Migraine headache without aura    Dr. Sharene SkeansHickling  . Obesity   . OCD (obsessive compulsive disorder)   . Seizures (HCC)   . Tension headache    episodic    No past surgical history on file.  Social History   Socioeconomic History  . Marital status: Single    Spouse name: Not on file  . Number of children: Not on file  . Years of education: Not on file  . Highest education level: Not on file  Occupational History  . Occupation: LobbyistCashier    Employer: [employer]  Social Needs  . Financial resource strain: Not on file  . Food insecurity:    Worry: Not on file    Inability: Not on file  . Transportation needs:    Medical: Not on file    Non-medical: Not on file  Tobacco Use  . Smoking status: Never Smoker  . Smokeless  tobacco: Never Used  Substance and Sexual Activity  . Alcohol use: No    Alcohol/week: 0.0 standard drinks  . Drug use: No  . Sexual activity: Yes  Lifestyle  . Physical activity:    Days per week: Not on file    Minutes per session: Not on file  . Stress: Not on file  Relationships  . Social connections:    Talks on phone: Not on file    Gets together: Not on file    Attends religious service: Not on file    Active member of club or organization: Not on file    Attends meetings of clubs or  organizations: Not on file    Relationship status: Not on file  . Intimate partner violence:    Fear of current or ex partner: Not on file    Emotionally abused: Not on file    Physically abused: Not on file    Forced sexual activity: Not on file  Other Topics Concern  . Not on file  Social History Narrative   [name] is a high [employer]ment[employer] graduate.   She graduated from [employer].    She lives with both parents and one sister, 20 yo.    She loves to hang out with friends, go to the beach and take selfies.    Family History  Problem Relation Age of Onset  . Migraines Mother        Onset young adulthood  . Migraines Maternal Grandmother   . Colon cancer Neg Hx   . Esophageal cancer Neg Hx   . Liver cancer Neg Hx   . Pancreatic cancer Neg Hx   . Rectal cancer Neg Hx   . Stomach cancer Neg Hx      Review of Systems  Constitutional: Negative.  Negative for chills and fever.  HENT: Negative.   Eyes: Negative.   Respiratory: Negative.  Negative for cough and shortness of breath.   Cardiovascular: Negative.  Negative for chest pain and palpitations.  Gastrointestinal: Negative.  Negative for abdominal pain, blood in stool, diarrhea, melena, nausea and vomiting.  Genitourinary: Positive for dysuria, frequency and urgency. Negative for flank pain and hematuria.  Skin: Negative.   Neurological: Negative for dizziness and headaches.  All other systems reviewed and are negative.   Vitals:   10/03/18 1547  BP: 96/64  Pulse: 67  Resp: 16  Temp: 98.6 F (37 C)  SpO2: 98%    Physical Exam Vitals signs reviewed.  Constitutional:      Appearance: Normal appearance.  HENT:     Head: Normocephalic and atraumatic.     Nose: Nose normal.  Eyes:     Extraocular Movements: Extraocular movements intact.     Conjunctiva/sclera: Conjunctivae normal.     Pupils: Pupils are equal, round, and reactive to light.  Neck:     Musculoskeletal: Normal range of motion and neck supple.    Cardiovascular:     Rate and Rhythm: Normal rate and regular rhythm.  Pulmonary:     Effort: Pulmonary effort is normal.     Breath sounds: Normal breath sounds.  Abdominal:     General: Abdomen is flat. Bowel sounds are normal.     Palpations: Abdomen is soft.     Tenderness: There is no abdominal tenderness. There is no right CVA tenderness or left CVA tenderness.  Musculoskeletal: Normal range of motion.  Skin:    General: Skin is warm and dry.     Capillary Refill: Capillary refill  takes less than 2 seconds.  Neurological:     General: No focal deficit present.     Mental Status: She is alert and oriented to person, place, and time.     Sensory: No sensory deficit.     Motor: No weakness.     Coordination: Coordination normal.  Psychiatric:        Mood and Affect: Mood normal.        Behavior: Behavior normal.      Results for orders placed or performed in visit on 10/03/18 (from the past 24 hour(s))  POCT urinalysis dipstick     Status: Abnormal   Collection Time: 10/03/18  4:01 PM  Result Value Ref Range   Color, UA yellow yellow   Clarity, UA cloudy (A) clear   Glucose, UA negative negative mg/dL   Bilirubin, UA negative negative   Ketones, POC UA negative negative mg/dL   Spec Grav, UA 1.610 9.604 - 1.025   Blood, UA moderate (A) negative   pH, UA 7.0 5.0 - 8.0   Protein Ur, POC negative negative mg/dL   Urobilinogen, UA 4.0 (A) 0.2 or 1.0 E.U./dL   Nitrite, UA Negative Negative   Leukocytes, UA Negative Negative  POCT urine pregnancy     Status: None   Collection Time: 10/03/18  4:02 PM  Result Value Ref Range   Preg Test, Ur Negative Negative  POCT Microscopic Urinalysis ([facility])     Status: Abnormal   Collection Time: 10/03/18  4:11 PM  Result Value Ref Range   WBC,UR,HPF,POC None None WBC/hpf   RBC,UR,HPF,POC Few (A) None RBC/hpf   Bacteria Many (A) None, Too numerous to count   Mucus Absent Absent   Epithelial Cells, UR Per Microscopy None None, Too  numerous to count cells/hpf     ASSESSMENT & PLAN: Rayli was seen today for urinary frequency.  Diagnoses and all orders for this visit:  Urinary frequency -     POCT urinalysis dipstick -     POCT urine pregnancy -     POCT Microscopic Urinalysis ([facility])  Acute UTI Comments: Partially treated Orders: -     sulfamethoxazole-trimethoprim (BACTRIM DS,SEPTRA DS) 800-160 MG tablet; Take 1 tablet by mouth 2 (two) times daily for 7 days. -     Urine Culture  Dysuria -     phenazopyridine (PYRIDIUM) 200 MG tablet; Take 1 tablet (200 mg total) by mouth 3 (three) times daily as needed for pain.    Patient Instructions       If you have lab work done today you will be contacted with your lab results within the next 2 weeks.  If you have not heard from [place] then please contact us. The fastest way to get your results is to register for My Chart.   IF you received an x-ray today, you will receive an invoice from [facility]. Please contact [facility] at [phone] with questions or concerns regarding your invoice.   IF you received labwork today, you will receive an invoice from [facility]. Please contact [facility] at [phone] with questions or concerns regarding your invoice.   Our billing staff will not be able to assist you with questions regarding bills from these companies.  You will be contacted with the lab results as soon as they are available. The fastest way to get your results is to activate your My Chart account. Instructions are located on the last page of this paperwork. If you have not heard from [place] regarding the  results in 2 weeks, please contact this office.     Urinary Tract Infection, Adult A urinary tract infection (UTI) is an infection of any part of the urinary tract. The urinary tract includes:  The kidneys.  The ureters.  The bladder.  The urethra. These organs make, store, and get rid of pee (urine) in the body. What are the  causes? This is caused by germs (bacteria) in your genital area. These germs grow and cause swelling (inflammation) of your urinary tract. What increases the risk? You are more likely to develop this condition if:  You have a small, thin tube (catheter) to drain pee.  You cannot control when you pee or poop (incontinence).  You are female, and: ? You use these methods to prevent pregnancy: ? A medicine that kills sperm (spermicide). ? A device that blocks sperm (diaphragm). ? You have low levels of a female hormone (estrogen). ? You are pregnant.  You have genes that add to your risk.  You are sexually active.  You take antibiotic medicines.  You have trouble peeing because of: ? A prostate that is bigger than normal, if you are female. ? A blockage in the part of your body that drains pee from the bladder (urethra). ? A kidney stone. ? A nerve condition that affects your bladder (neurogenic bladder). ? Not getting enough to drink. ? Not peeing often enough.  You have other conditions, such as: ? Diabetes. ? A weak disease-fighting system (immune system). ? Sickle cell disease. ? Gout. ? Injury of the spine. What are the signs or symptoms? Symptoms of this condition include:  Needing to pee right away (urgently).  Peeing often.  Peeing small amounts often.  Pain or burning when peeing.  Blood in the pee.  Pee that smells bad or not like normal.  Trouble peeing.  Pee that is cloudy.  Fluid coming from the vagina, if you are female.  Pain in the belly or lower back. Other symptoms include:  Throwing up (vomiting).  No urge to eat.  Feeling mixed up (confused).  Being tired and grouchy (irritable).  A fever.  Watery poop (diarrhea). How is this treated? This condition may be treated with:  Antibiotic medicine.  Other medicines.  Drinking enough water. Follow these instructions at home:  Medicines  Take over-the-counter and prescription  medicines only as told by your doctor.  If you were prescribed an antibiotic medicine, take it as told by your doctor. Do not stop taking it even if you start to feel better. General instructions  Make sure you: ? Pee until your bladder is empty. ? Do not hold pee for a long time. ? Empty your bladder after sex. ? Wipe from front to back after pooping if you are a female. Use each tissue one time when you wipe.  Drink enough fluid to keep your pee pale yellow.  Keep all follow-up visits as told by your doctor. This is important. Contact a doctor if:  You do not get better after 1-2 days.  Your symptoms go away and then come back. Get help right away if:  You have very bad back pain.  You have very bad pain in your lower belly.  You have a fever.  You are sick to your stomach (nauseous).  You are throwing up. Summary  A urinary tract infection (UTI) is an infection of any part of the urinary tract.  This condition is caused by germs in your genital area.  There  are many risk factors for a UTI. These include having a small, thin tube to drain pee and not being able to control when you pee or poop.  Treatment includes antibiotic medicines for germs.  Drink enough fluid to keep your pee pale yellow. This information is not intended to replace advice given to you by your health care provider. Make sure you discuss any questions you have with your health care provider. Document Released: 03/08/2008 Document Revised: 03/30/2018 Document Reviewed: 03/30/2018 Elsevier Interactive Patient Education  2019 Elsevier Inc.      Edwina Barth, MD [facility]

## 2018-10-03 NOTE — Patient Instructions (Addendum)
   If you have lab work done today you will be contacted with your lab results within the next 2 weeks.  If you have not heard from us then please contact us. The fastest way to get your results is to register for My Chart.   IF you received an x-ray today, you will receive an invoice from [facility]. Please contact [facility] at [phone] with questions or concerns regarding your invoice.   IF you received labwork today, you will receive an invoice from [facility]. Please contact [facility] at [phone] with questions or concerns regarding your invoice.   Our billing staff will not be able to assist you with questions regarding bills from these companies.  You will be contacted with the lab results as soon as they are available. The fastest way to get your results is to activate your My Chart account. Instructions are located on the last page of this paperwork. If you have not heard from us regarding the results in 2 weeks, please contact this office.     Urinary Tract Infection, Adult A urinary tract infection (UTI) is an infection of any part of the urinary tract. The urinary tract includes:  The kidneys.  The ureters.  The bladder.  The urethra. These organs make, store, and get rid of pee (urine) in the body. What are the causes? This is caused by germs (bacteria) in your genital area. These germs grow and cause swelling (inflammation) of your urinary tract. What increases the risk? You are more likely to develop this condition if:  You have a small, thin tube (catheter) to drain pee.  You cannot control when you pee or poop (incontinence).  You are female, and: ? You use these methods to prevent pregnancy: ? A medicine that kills sperm (spermicide). ? A device that blocks sperm (diaphragm). ? You have low levels of a female hormone (estrogen). ? You are pregnant.  You have genes that add to your risk.  You are sexually active.  You take  antibiotic medicines.  You have trouble peeing because of: ? A prostate that is bigger than normal, if you are female. ? A blockage in the part of your body that drains pee from the bladder (urethra). ? A kidney stone. ? A nerve condition that affects your bladder (neurogenic bladder). ? Not getting enough to drink. ? Not peeing often enough.  You have other conditions, such as: ? Diabetes. ? A weak disease-fighting system (immune system). ? Sickle cell disease. ? Gout. ? Injury of the spine. What are the signs or symptoms? Symptoms of this condition include:  Needing to pee right away (urgently).  Peeing often.  Peeing small amounts often.  Pain or burning when peeing.  Blood in the pee.  Pee that smells bad or not like normal.  Trouble peeing.  Pee that is cloudy.  Fluid coming from the vagina, if you are female.  Pain in the belly or lower back. Other symptoms include:  Throwing up (vomiting).  No urge to eat.  Feeling mixed up (confused).  Being tired and grouchy (irritable).  A fever.  Watery poop (diarrhea). How is this treated? This condition may be treated with:  Antibiotic medicine.  Other medicines.  Drinking enough water. Follow these instructions at home:  Medicines  Take over-the-counter and prescription medicines only as told by your doctor.  If you were prescribed an antibiotic medicine, take it as told by your doctor. Do not stop taking it even if   you start to feel better. General instructions  Make sure you: ? Pee until your bladder is empty. ? Do not hold pee for a long time. ? Empty your bladder after sex. ? Wipe from front to back after pooping if you are a female. Use each tissue one time when you wipe.  Drink enough fluid to keep your pee pale yellow.  Keep all follow-up visits as told by your doctor. This is important. Contact a doctor if:  You do not get better after 1-2 days.  Your symptoms go away and then come  back. Get help right away if:  You have very bad back pain.  You have very bad pain in your lower belly.  You have a fever.  You are sick to your stomach (nauseous).  You are throwing up. Summary  A urinary tract infection (UTI) is an infection of any part of the urinary tract.  This condition is caused by germs in your genital area.  There are many risk factors for a UTI. These include having a small, thin tube to drain pee and not being able to control when you pee or poop.  Treatment includes antibiotic medicines for germs.  Drink enough fluid to keep your pee pale yellow. This information is not intended to replace advice given to you by your health care provider. Make sure you discuss any questions you have with your health care provider. Document Released: 03/08/2008 Document Revised: 03/30/2018 Document Reviewed: 03/30/2018 Elsevier Interactive Patient Education  2019 Elsevier Inc.  

## 2018-10-04 LAB — URINE CULTURE

## 2018-10-05 ENCOUNTER — Encounter: Payer: Self-pay | Admitting: *Deleted

## 2018-10-05 NOTE — Progress Notes (Signed)
Letter sent.

## 2018-10-09 ENCOUNTER — Telehealth: Payer: Self-pay | Admitting: Emergency Medicine

## 2018-10-09 NOTE — Telephone Encounter (Signed)
Please advise 

## 2018-10-09 NOTE — Telephone Encounter (Signed)
Message was originally sent to the wrong pool.

## 2018-10-09 NOTE — Telephone Encounter (Signed)
Pt called to check status and you will be going out of town so please call pt for updated pharmacy. Please advise

## 2018-10-09 NOTE — Telephone Encounter (Signed)
Spoke with pt mother about concerns and informed her that the pt would have to schedule an appointment to be re-evaluated. She verbalized understanding.

## 2018-10-09 NOTE — Telephone Encounter (Signed)
Copied from CRM [id number]. Topic: Quick Communication - See Telephone Encounter >> Oct 09, 2018  8:39 AM Maye Hidesoley, Sarah wrote: CRM for notification. See Telephone encounter for: 10/09/18. Pt's mother Marylene Landngela called stating that patient still has burning with urination and bladder pressure, nausea.She does not feel any better since last office visit.Wants medication sent to [id number] - [place], [place] - [address] [phone] (Phone) [phone] (Fax)

## 2018-10-09 NOTE — Telephone Encounter (Signed)
Needs OV.  Thanks. 

## 2018-10-09 NOTE — Telephone Encounter (Signed)
Please call pt's cell: [phone]  Or  Mom's Cell Marylene Land): [phone]

## 2019-04-17 ENCOUNTER — Telehealth (INDEPENDENT_AMBULATORY_CARE_PROVIDER_SITE_OTHER): Payer: Self-pay | Admitting: Pediatrics

## 2019-04-17 ENCOUNTER — Other Ambulatory Visit (INDEPENDENT_AMBULATORY_CARE_PROVIDER_SITE_OTHER): Payer: Self-pay | Admitting: Pediatrics

## 2019-04-17 DIAGNOSIS — G43009 Migraine without aura, not intractable, without status migrainosus: Secondary | ICD-10-CM

## 2019-04-17 NOTE — Telephone Encounter (Signed)
Please call family to schedule a follow-up for patient to be able to receive future medication refills.   Thank you

## 2019-04-23 NOTE — Telephone Encounter (Signed)
Follow up appointment was scheduled.

## 2019-05-13 ENCOUNTER — Other Ambulatory Visit (INDEPENDENT_AMBULATORY_CARE_PROVIDER_SITE_OTHER): Payer: Self-pay | Admitting: Pediatrics

## 2019-05-13 DIAGNOSIS — G43009 Migraine without aura, not intractable, without status migrainosus: Secondary | ICD-10-CM

## 2019-05-14 NOTE — Telephone Encounter (Signed)
Please send to the pharmacy °

## 2019-05-15 ENCOUNTER — Telehealth (INDEPENDENT_AMBULATORY_CARE_PROVIDER_SITE_OTHER): Payer: 59 | Admitting: Pediatrics

## 2019-05-15 DIAGNOSIS — Z3491 Encounter for supervision of normal pregnancy, unspecified, first trimester: Secondary | ICD-10-CM

## 2019-05-15 DIAGNOSIS — G43009 Migraine without aura, not intractable, without status migrainosus: Secondary | ICD-10-CM

## 2019-05-15 NOTE — Telephone Encounter (Signed)
  This is a [facility] E-Visit follow up consult provided via [place] and their parent Emry Tobin (name of consenting adult) consented to an E-Visit consult today.  Location of patient: Katherine Wagner is at home Location of provider: [facility] Flemings [facility] at [facility] Patient was referred by No ref. provider found   The following participants were involved in this E-Visit: Mother and Dr. Gaynell Face  Chief Complaint/ Reason for E-Visit today: Unexpected pregnancy Total time on call: 7-1/2 minutes Follow up: By telephone  Kennadee recently tested herself for a missed menstrual period.  Pregnancy test was positive.  She takes Qudexy, extended release topiramate for her migraines.  This is known to cause birth defects (cleft lip cleft palate) and 1.1% of women taking the medication in the first trimester.  This is opposed to 0.12% of women of childbearing age who are not taking medication.  I recommended that she immediately discontinue the medication.  I told mother that she could only take Tylenol for her headaches.  This is going to be problematic.  We might consider starting propranolol as a preventative.  I had good success with this and no known birth defects in mother's taking it.  I will not prescribe this until she has a GYN and we have consent to do that.  We need to send a headache calendar to the home.  I will be happy to see Damyia in follow-up.  We may be able to start topiramate after the first trimester but I would not do so until such time as an ultrasound is been done and we note that the face is properly formed.  I answered mother's questions in detail.  Princess Bruins. Gaynell Face, MD

## 2019-05-15 NOTE — Telephone Encounter (Signed)
Who's calling (name and relationship to patient) : Wanita Chamberlain (mom)  Best contact number: [phone]  Provider they see: Dr. Gaynell Face  Reason for call:  Mom called in stating that Katherine Wagner has just found out she was pregnant, mom states maybe only a couple weeks. Wants to know if Devone needs to continue the Qudexy or what Dr. Gaynell Face recommends. Please advise with a phone call back.   Call ID:      PRESCRIPTION REFILL ONLY  Name of prescription:  Pharmacy:

## 2019-05-18 ENCOUNTER — Telehealth (INDEPENDENT_AMBULATORY_CARE_PROVIDER_SITE_OTHER): Payer: 59 | Admitting: Pediatrics

## 2019-05-18 DIAGNOSIS — G43009 Migraine without aura, not intractable, without status migrainosus: Secondary | ICD-10-CM | POA: Diagnosis not present

## 2019-05-18 NOTE — Telephone Encounter (Signed)
  Who's calling (name and relationship to patient) : Wanita Chamberlain, mom  Best contact number: [phone]  Provider they see: Dr. Gaynell Face  Reason for call: Mom called requesting to speak with Dr. Gaynell Face. Needs advice regarding patient being taken off of a medication called Qudexy. Mom states Dr.Hickling took patient off due to patient being pregnant. Since patient has been taken off she has experienced being dizzy, shaking, and feels like her pulse is racing according to mom. Mom wants to know if this is because she has been taken off of this medication or could it just be because she is pregnant. Would like to discuss the side effects of no longer taking Qudexy. Please advise.    PRESCRIPTION REFILL ONLY  Name of prescription:  Pharmacy:

## 2019-05-21 ENCOUNTER — Telehealth (INDEPENDENT_AMBULATORY_CARE_PROVIDER_SITE_OTHER): Payer: Self-pay | Admitting: Pediatrics

## 2019-05-21 NOTE — Telephone Encounter (Signed)
This call came in after I left the office for the day.  I left a message asking mom to call back to let me know how she was at this time.

## 2019-05-21 NOTE — Telephone Encounter (Signed)
  This is a Pediatric Specialist E-Visit follow up consult provided via Katherine Wagner and her mother, Katherine Wagner consented to an E-Visit consult today.  [name] is 23 years old and does not need her mother to consent. Location of patient: [name] is at home Location of provider: Sherron Flemings is at [facility] Patient was referred by No ref. provider found   The following participants were involved in this E-Visit: Delma Post, Dr. Gaynell Face  Chief Complaint/ Reason for E-Visit today: [name] occipital headaches, dizziness, nausea Total time on call: 9 minutes 50 seconds Follow up: Next scheduled appointment  [name] is pregnant.  We had to take her off topiramate to avoid the 1.1% side effect of cleft lip cleft palate.  She complains of being dizzy nauseated and having sharp pains in her neck.  She injured her shoulder which may be a cause of the pain in her neck.  This is different from her typical migraines.  She has how much acetaminophen she could take and I told her 500 mg twice daily until she talks to Dr. Corinna Capra, her obstetrician.  I also talked to the family about propranolol.  I have used this with other pregnant women.  We need to get his approval before I will start it.  It may not work.  I strongly recommended that she get adequate sleep, that she hydrate herself 48 ounces of fluid per day and that she not skip meals.  I do not know if his nausea is related to migraine, more likely it is a manifestation of her pregnancy.

## 2019-05-21 NOTE — Telephone Encounter (Signed)
Who's calling (name and relationship to patient) : Katherine Wagner (mom)  Best contact number: [phone]  Provider they see: Dr. Gaynell Face  Reason for call:  Mom called in to give Dr. Gaynell Face a request per his instructions, Katherine Wagner is pregnant and was taken off of the Qudenxy. Mom states she has been having a hard time with this. Please advise mom with a phone call back.   Call ID:      PRESCRIPTION REFILL ONLY  Name of prescription:  Pharmacy:

## 2019-05-24 ENCOUNTER — Ambulatory Visit (INDEPENDENT_AMBULATORY_CARE_PROVIDER_SITE_OTHER): Payer: 59 | Admitting: Pediatrics

## 2019-05-28 ENCOUNTER — Ambulatory Visit (INDEPENDENT_AMBULATORY_CARE_PROVIDER_SITE_OTHER): Payer: 59 | Admitting: Pediatrics

## 2019-05-30 LAB — OB RESULTS CONSOLE RUBELLA ANTIBODY, IGM: Rubella: IMMUNE

## 2019-05-30 LAB — OB RESULTS CONSOLE ABO/RH: RH Type: POSITIVE

## 2019-05-30 LAB — OB RESULTS CONSOLE HEPATITIS B SURFACE ANTIGEN: Hepatitis B Surface Ag: NEGATIVE

## 2019-05-30 LAB — OB RESULTS CONSOLE RPR: RPR: NONREACTIVE

## 2019-05-30 LAB — OB RESULTS CONSOLE HIV ANTIBODY (ROUTINE TESTING): HIV: NONREACTIVE

## 2019-05-30 LAB — OB RESULTS CONSOLE ANTIBODY SCREEN: Antibody Screen: NEGATIVE

## 2019-08-27 ENCOUNTER — Emergency Department (HOSPITAL_COMMUNITY): Payer: 59

## 2019-08-27 ENCOUNTER — Encounter (HOSPITAL_COMMUNITY): Payer: Self-pay

## 2019-08-27 ENCOUNTER — Other Ambulatory Visit: Payer: Self-pay

## 2019-08-27 ENCOUNTER — Emergency Department (HOSPITAL_COMMUNITY)
Admission: EM | Admit: 2019-08-27 | Discharge: 2019-08-27 | Disposition: A | Discharge status: Home / Self Care | Payer: 59 | Attending: Emergency Medicine | Admitting: Emergency Medicine

## 2019-08-27 DIAGNOSIS — B9689 Other specified bacterial agents as the cause of diseases classified elsewhere: Secondary | ICD-10-CM | POA: Diagnosis not present

## 2019-08-27 DIAGNOSIS — O2342 Unspecified infection of urinary tract in pregnancy, second trimester: Secondary | ICD-10-CM | POA: Insufficient documentation

## 2019-08-27 DIAGNOSIS — O219 Vomiting of pregnancy, unspecified: Secondary | ICD-10-CM | POA: Diagnosis present

## 2019-08-27 DIAGNOSIS — Z3A20 20 weeks gestation of pregnancy: Secondary | ICD-10-CM | POA: Insufficient documentation

## 2019-08-27 DIAGNOSIS — Z79899 Other long term (current) drug therapy: Secondary | ICD-10-CM | POA: Diagnosis not present

## 2019-08-27 LAB — URINALYSIS, ROUTINE W REFLEX MICROSCOPIC
Bilirubin Urine: NEGATIVE
Glucose, UA: 50 mg/dL — AB
Hgb urine dipstick: NEGATIVE
Ketones, ur: 20 mg/dL — AB
Nitrite: NEGATIVE
Protein, ur: NEGATIVE mg/dL
Specific Gravity, Urine: 1.019 (ref 1.005–1.030)
pH: 6 (ref 5.0–8.0)

## 2019-08-27 LAB — CBC
HCT: 36.5 % (ref 36.0–46.0)
Hemoglobin: 12.3 g/dL (ref 12.0–15.0)
MCH: 32.1 pg (ref 26.0–34.0)
MCHC: 33.7 g/dL (ref 30.0–36.0)
MCV: 95.3 fL (ref 80.0–100.0)
Platelets: 249 10*3/uL (ref 150–400)
RBC: 3.83 MIL/uL — ABNORMAL LOW (ref 3.87–5.11)
RDW: 12.5 % (ref 11.5–15.5)
WBC: 8.7 10*3/uL (ref 4.0–10.5)
nRBC: 0 % (ref 0.0–0.2)

## 2019-08-27 LAB — COMPREHENSIVE METABOLIC PANEL
ALT: 13 U/L (ref 0–44)
AST: 17 U/L (ref 15–41)
Albumin: 3.7 g/dL (ref 3.5–5.0)
Alkaline Phosphatase: 40 U/L (ref 38–126)
Anion gap: 8 (ref 5–15)
BUN: 10 mg/dL (ref 6–20)
CO2: 23 mmol/L (ref 22–32)
Calcium: 8.9 mg/dL (ref 8.9–10.3)
Chloride: 104 mmol/L (ref 98–111)
Creatinine, Ser: 0.57 mg/dL (ref 0.44–1.00)
GFR calc Af Amer: >60 mL/min (ref >60)
GFR calc non Af Amer: >60 mL/min (ref >60)
Glucose, Bld: 159 mg/dL — ABNORMAL HIGH (ref 70–99)
Potassium: 3.6 mmol/L (ref 3.5–5.1)
Sodium: 135 mmol/L (ref 135–145)
Total Bilirubin: 0.9 mg/dL (ref 0.3–1.2)
Total Protein: 6.5 g/dL (ref 6.5–8.1)

## 2019-08-27 LAB — LIPASE, BLOOD: Lipase: 58 U/L — ABNORMAL HIGH (ref 11–51)

## 2019-08-27 LAB — I-STAT BETA HCG BLOOD, ED (MC, WL, AP ONLY): I-stat hCG, quantitative: >2000 m[IU]/mL — ABNORMAL HIGH (ref <5)

## 2019-08-27 MED ORDER — ACETAMINOPHEN 500 MG PO TABS
1000.0000 mg | ORAL_TABLET | Freq: Once | ORAL | Status: AC
  Administered 2019-08-27: 1000 mg via ORAL
  Filled 2019-08-27: qty 2

## 2019-08-27 MED ORDER — SODIUM CHLORIDE 0.9 % IV BOLUS
500.0000 mL | Freq: Once | INTRAVENOUS | Status: AC
  Administered 2019-08-27: 500 mL via INTRAVENOUS

## 2019-08-27 MED ORDER — CEPHALEXIN 500 MG PO CAPS: 500.0000 mg | ORAL_CAPSULE | Freq: Four times a day (QID) | ORAL | 0 refills | Status: AC

## 2019-08-27 MED ORDER — CEPHALEXIN 500 MG PO CAPS
500.0000 mg | ORAL_CAPSULE | Freq: Once | ORAL | Status: AC
  Administered 2019-08-27: 500 mg via ORAL
  Filled 2019-08-27: qty 1

## 2019-08-27 MED ORDER — SODIUM CHLORIDE 0.9% FLUSH: 3.0000 mL | Freq: Once | INTRAVENOUS | Status: DC

## 2019-08-27 NOTE — ED Notes (Signed)
Pt ambulated to the bathroom at this time.

## 2019-08-27 NOTE — ED Notes (Signed)
Patient able to tolerate water and graham crackers.

## 2019-08-27 NOTE — ED Triage Notes (Addendum)
Pt presents with c/o right side flank pain that initially started last week and then came back today. Pt reports that she has never had kidney stones before. Pt reports the pain comes and goes. Pt also reports vomiting. Pt reports she is approx [redacted] weeks pregnant.

## 2019-08-27 NOTE — ED Provider Notes (Signed)
[facility]-EMERGENCY DEPT Provider Note   CSN: [id number] Arrival date & time: 08/27/19  1405     History   Chief Complaint Chief Complaint  Patient presents with   Flank Pain   Emesis    HPI Katherine Wagner is a 23 y.o. female G1, P0, 6194w3d with a past medical history of OCD, seizures, migraine headaches, obesity, who presents today for evaluation of right-sided abdominal and flank pain in pregnancy.  She reports that about 1 week ago she had a brief pain in her right sided abdomen and flank.  She reports that today she had right-sided pain when she woke up.  She went to the store and had severe right-sided abdominal/flank pain. The pain caused her to vomit.  The pain has improved since.  She reports that for about the past month she has had increased urination.  She denies increased frequency or urgency.  She states that she has not had any trauma recently.  She denies any vaginal bleeding or spotting.  No loss of fluids.  [facility]  She reports that she has had a ultrasound so far that was unremarkable and her pregnancy thus far has been uneventful.     HPI  Past Medical History:  Diagnosis Date   Allergy    Depression    GERD (gastroesophageal reflux disease)    Migraine headache without aura    Dr. 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 No pertinent surgical history.   OB History    Gravida  1   Para      Term      Preterm      AB      Living        SAB      TAB      Ectopic      Multiple      Live Births               Home Medications    Prior to Admission medications   Medication Sig Start Date End Date Taking? Authorizing Provider  fluvoxaMINE (LUVOX) 100 MG tablet Take 100 mg by mouth at bedtime.   Yes [provider]  Prenatal Vit-Fe Fumarate-FA (PRENATAL MULTIVITAMIN) TABS tablet Take 1 tablet by mouth at bedtime.   Yes [provider]  promethazine (PHENERGAN) 25 MG tablet Take 12.5-25 mg by mouth every 8 (eight) hours as needed for nausea or vomiting.  08/16/19  Yes [provider]  cephALEXin (KEFLEX) 500 MG capsule Take 1 capsule (500 mg  total) by mouth 4 (four) times daily for 7 days. 08/27/19 09/03/19  Cristina Gong, PA-C  ibuprofen (ADVIL,MOTRIN) 600 MG tablet Take 1 tablet (600 mg total) by mouth every 6 (six) hours as needed for mild pain. Patient not taking: Reported on 08/27/2019 05/12/14   Marcellina Millin, MD  phenazopyridine (PYRIDIUM) 200 MG tablet Take 1 tablet (200 mg total) by mouth 3 (three) times daily as needed for pain. Patient not taking: Reported on 08/27/2019 10/03/18   Georgina Quint, MD  QUDEXY XR 100 MG CS24 sprinkle capsule TAKE 1 CAPSULE BY MOUTH EVERYDAY AT BEDTIME Patient not taking: Reported on 08/27/2019 05/14/19   Deetta Perla, MD    Family History Family History  Problem Relation Age of Onset   Migraines Mother        Onset young  adulthood   Migraines Maternal Grandmother    Colon cancer Neg Hx    Esophageal cancer Neg Hx    Liver cancer Neg Hx    Pancreatic cancer Neg Hx    Rectal cancer Neg Hx    Stomach cancer Neg Hx     Social History Social History   Tobacco Use   Smoking status: Never Smoker   Smokeless tobacco: Never Used  Substance Use Topics   Alcohol use: No    Alcohol/week: 0.0 standard drinks   Drug use: No     Allergies   Patient has no known allergies.   Review of Systems Review of Systems  Constitutional: Negative for chills and fever.  Eyes: Negative for visual disturbance.  Respiratory: Negative for cough and shortness of breath.   Cardiovascular: Negative for chest pain.  Gastrointestinal: Positive for abdominal pain, nausea and vomiting. Negative for constipation and diarrhea.  Genitourinary: Positive for flank pain and frequency. Negative for dysuria, urgency, vaginal bleeding and vaginal discharge.  Musculoskeletal: Negative for neck pain.  Skin: Negative for color change and rash.  Neurological: Negative for weakness and headaches.  All other systems reviewed and are negative.    Physical Exam Updated Vital Signs BP 111/67 (BP Location: Left Arm)    Pulse 85    Temp 98.1 F (36.7 C) (Oral)    Resp 18    Ht  (1.626 m)    Wt 63 kg    LMP 09/30/2018    SpO2 100%    BMI 23.86 kg/m   Physical Exam Vitals signs and nursing note reviewed.  Constitutional:      General: She is not in acute distress.    Appearance: She is well-developed. She is not diaphoretic.  HENT:     Head: Normocephalic and atraumatic.  Eyes:     General: No scleral icterus.       Right eye: No discharge.        Left eye: No discharge.     Conjunctiva/sclera: Conjunctivae normal.  Neck:     Musculoskeletal: Normal range of motion and neck supple.  Cardiovascular:     Rate and Rhythm: Normal rate and regular rhythm.     Pulses: Normal pulses.     Heart sounds: Normal heart  sounds.  Pulmonary:     Effort: Pulmonary effort is normal. No respiratory distress.     Breath sounds: No stridor.  Abdominal:     General: There is no distension.     Comments: Gravid, uterus palpable at the level of the umbilicus.  Mild right lower quadrant abdominal tenderness.  Mild right-sided CVA tenderness to percussion.  No rebound  or guarding.  Musculoskeletal:        General: No deformity.     Right lower leg: No edema.     Left lower leg: No edema.  Skin:    General: Skin is warm and dry.  Neurological:     General: No focal deficit present.     Mental Status: She is alert.     Cranial Nerves: No cranial nerve deficit.     Motor: No abnormal muscle tone.  Psychiatric:        Mood and Affect: Mood normal.        Behavior: Behavior normal.      ED Treatments / Results  Labs (all labs ordered are listed, but only abnormal results are displayed) Labs Reviewed  LIPASE, BLOOD - Abnormal; Notable for the following components:      Result Value   Lipase 58 (*)    All other components within normal limits  COMPREHENSIVE METABOLIC PANEL - Abnormal; Notable for the following components:   Glucose, Bld 159 (*)    All other components within normal limits  CBC - Abnormal; Notable for the following components:   RBC 3.83 (*)    All other components within normal limits  URINALYSIS, ROUTINE W REFLEX MICROSCOPIC - Abnormal; Notable for the following components:   APPearance CLOUDY (*)    Glucose, UA 50 (*)    Ketones, ur 20 (*)    Leukocytes,Ua MODERATE (*)    Bacteria, UA FEW (*)    All other components within normal limits  I-STAT BETA HCG BLOOD, ED ([facility], [facility], [facility] ONLY) - Abnormal; Notable for the following components:   I-stat hCG, quantitative >2,000.0 (*)    All other components within normal limits  URINE CULTURE    EKG None  Radiology US Abdomen Complete  Result Date: 08/27/2019 CLINICAL DATA:  Abdominal pain and flank pain. Mild lipase elevation. EXAM:  ABDOMEN ULTRASOUND COMPLETE COMPARISON:  None. FINDINGS: Gallbladder: No gallstones or wall thickening visualized. No sonographic Murphy sign noted by sonographer. Common bile duct: Diameter: 2.2 mm, Liver: No focal lesion identified. Within normal limits in parenchymal echogenicity. Portal vein is patent on color Doppler imaging with normal direction of blood flow towards the liver. IVC: No abnormality visualized. Pancreas: The visualized portion of the pancreas is normal. Spleen: Size and appearance within normal limits. Right Kidney: Length: 11.1 cm. Echogenicity within normal limits. No mass or hydronephrosis visualized. Left Kidney: Length: 11.5 cm. Echogenicity within normal limits. No mass or hydronephrosis visualized. Abdominal aorta: No aneurysm visualized. Other findings: The appendix was not visualized. IMPRESSION: No visualized abnormalities. Specifically, no discrete abnormality region of the pancreas. The appendix was not visualized. Electronically Signed   By: Lorriane Shire M.D.   On: 08/27/2019 18:48   US Ob Limited > 14 Wks  Result Date: 08/27/2019 CLINICAL DATA:  23 year old pregnant female presents with vomiting and right sided abdominal pain radiating to the back. Elevated lipase. Assigned EDC 01/11/2020, projecting to an expected gestational age of [redacted] weeks 3 days EXAM: LIMITED OBSTETRIC ULTRASOUND FINDINGS: Number of Fetuses: 1 Heart Rate:  150 bpm Movement: Yes Presentation: Cephalic Placental Location: Anterior Previa: No Amniotic Fluid (Subjective):  Within normal limits. BPD: 4.7 cm 20 w  1 d      [place] EDC: 01/13/2020 MATERNAL FINDINGS: Cervix:  Appears closed on limited transabdominal views. Uterus/Adnexae: No abnormality visualized. IMPRESSION: 1. Single living intrauterine gestation in cephalic lie at 20 weeks 1 day by limited fetal biometry, concordant with provided  dating. 2. No acute gestational abnormality. This exam is performed on an emergent basis and does not comprehensively  evaluate fetal size, dating, or anatomy; follow-up complete OB US should be considered if further fetal assessment is warranted. Electronically Signed   By: Delbert Phenix M.D.   On: 08/27/2019 18:31    Procedures Procedures (including critical care time)  Medications Ordered in ED Medications  sodium chloride flush (NS) 0.9 % injection 3 mL (0 mLs Intravenous Hold 08/27/19 1832)  sodium chloride 0.9 % bolus 500 mL (0 mLs Intravenous Stopped 08/27/19 1951)  cephALEXin (KEFLEX) capsule 500 mg (500 mg Oral Given 08/27/19 2033)  acetaminophen (TYLENOL) tablet 1,000 mg (1,000 mg Oral Given 08/27/19 2033)     Initial Impression / Assessment and Plan / ED Course  I have reviewed the triage vital signs and the nursing notes.  Pertinent labs & imaging results that were available during my care of the patient were reviewed by me and considered in my medical decision making (see chart for details).  Clinical Course as of Aug 27 24  Mon Aug 27, 2019  2005 Spoke with Dr. Henderson Cloud of [facility] in [place].  He recommends starting her on Keflex 500 mg 4 times a day and to have her follow-up with them in the office tomorrow.   [EH]    Clinical Course User Index [EH] Cristina Gong, PA-C      Patient is a 22 year old G1, P0 [redacted]w[redacted]d who presents today for evaluation of right lower quadrant abdominal pain and right flank pain.  On exam she is generally well-appearing and in no distress.  She is afebrile, not tachycardic or tachypneic.  As were obtained reviewed she does not have a significant leukocytosis or anemia.  Lipase is minimally elevated at 58.  Glucose is slightly elevated at 159.  Urine is cloudy with 50 glucose, and 20 ketones with moderate leukocytes with microscopy revealing 0-5 red cells, 11-20 white cells few bacteria and only 0-5 squamous cells.  Urine was sent for culture as patient is pregnant. Abdominal ultrasound was obtained, there is no evidence of hydro or urinary  obstruction.  No abnormalities noted of the pancreas, or liver.  The appendix was not visualized.  Limited OB ultrasound was obtained which was reassuring. I spoke with on-call physician Dr. Henderson Cloud for [facility] in [place] who recommends starting her on p.o. Keflex 500 mg 4 times a day and discharging her tonight with close outpatient follow-up in the office tomorrow. Patient was able to p.o. challenge without difficulty while in the emergency room.  Return precautions were discussed with patient who states their understanding.  At the time of discharge patient denied any unaddressed complaints or concerns.  Patient is agreeable for discharge home.   Final Clinical Impressions(s) / ED Diagnoses   Final diagnoses:  Urinary tract infection in mother during second trimester of pregnancy    ED Discharge Orders         Ordered    cephALEXin (KEFLEX) 500 MG capsule  4 times daily     08/27/19 2111           Cristina Gong, PA-C 08/28/19 Jamas Lav, MD 08/28/19 [phone]

## 2019-08-27 NOTE — ED Notes (Signed)
US at bedside

## 2019-08-27 NOTE — ED Notes (Signed)
Sent culture with urine sample.

## 2019-08-27 NOTE — Discharge Instructions (Signed)
Please take Tylenol (acetaminophen) to relieve your pain.  You may take tylenol, up to 1,000 mg (two extra strength pills).  Do not take more than 3,000 mg tylenol in a 24 hour period.  Please check all medication labels as many medications such as pain and cold medications may contain tylenol. Please do not drink alcohol while taking this medication.  ° °You may have diarrhea from the antibiotics.  It is very important that you continue to take the antibiotics even if you get diarrhea unless a medical professional tells you that you may stop taking them.  If you stop too early the bacteria you are being treated for will become stronger and you may need different, more powerful antibiotics that have more side effects and worsening diarrhea.  Please stay well hydrated and consider probiotics as they may decrease the severity of your diarrhea.   ° °

## 2019-08-28 LAB — URINE CULTURE: Culture: NO GROWTH

## 2019-10-05 NOTE — L&D Delivery Note (Signed)
Delivery Note  SVD viable female Apgars 9,9 over intact perineum.  Placenta delivered spontaneously intact with 3VC. Good support and hemostasis noted.  R/V exam confirms.  PH art was sent.   Mother and baby to couplet care and are doing well.  EBL 100cc  Delson Dulworth, MD 

## 2019-11-16 ENCOUNTER — Inpatient Hospital Stay (HOSPITAL_COMMUNITY)
Admission: AD | Admit: 2019-11-16 | Discharge: 2019-11-17 | Disposition: A | Discharge status: Home / Self Care | Payer: 59 | Attending: Obstetrics and Gynecology | Admitting: Obstetrics and Gynecology

## 2019-11-16 ENCOUNTER — Other Ambulatory Visit: Payer: Self-pay

## 2019-11-16 ENCOUNTER — Encounter (HOSPITAL_COMMUNITY): Payer: Self-pay | Admitting: Obstetrics and Gynecology

## 2019-11-16 DIAGNOSIS — O4693 Antepartum hemorrhage, unspecified, third trimester: Secondary | ICD-10-CM | POA: Insufficient documentation

## 2019-11-16 DIAGNOSIS — E669 Obesity, unspecified: Secondary | ICD-10-CM | POA: Diagnosis not present

## 2019-11-16 DIAGNOSIS — O99213 Obesity complicating pregnancy, third trimester: Secondary | ICD-10-CM | POA: Insufficient documentation

## 2019-11-16 DIAGNOSIS — O99891 Other specified diseases and conditions complicating pregnancy: Secondary | ICD-10-CM | POA: Diagnosis not present

## 2019-11-16 DIAGNOSIS — R109 Unspecified abdominal pain: Secondary | ICD-10-CM | POA: Diagnosis not present

## 2019-11-16 DIAGNOSIS — Z3689 Encounter for other specified antenatal screening: Secondary | ICD-10-CM

## 2019-11-16 DIAGNOSIS — Z3A32 32 weeks gestation of pregnancy: Secondary | ICD-10-CM | POA: Diagnosis not present

## 2019-11-16 DIAGNOSIS — O4690 Antepartum hemorrhage, unspecified, unspecified trimester: Secondary | ICD-10-CM

## 2019-11-16 LAB — URINALYSIS, ROUTINE W REFLEX MICROSCOPIC
Bilirubin Urine: NEGATIVE
Glucose, UA: NEGATIVE mg/dL
Ketones, ur: NEGATIVE mg/dL
Leukocytes,Ua: NEGATIVE
Nitrite: NEGATIVE
Protein, ur: NEGATIVE mg/dL
RBC / HPF: >50 RBC/hpf — ABNORMAL HIGH (ref 0–5)
Specific Gravity, Urine: 1.013 (ref 1.005–1.030)
pH: 6 (ref 5.0–8.0)

## 2019-11-16 NOTE — MAU Note (Signed)
PT SAYS  AT 7  AND 9PM-  WENT TO B-ROOM-  BLOOD IN TOILET.   IN TRIAGE- NOTHING ON UNDERWEAR .   FEELS CRAMPS -  STARTED AT 5 PM. PNC WITH  DR LOWE.  LAST SEX- LAST Friday .

## 2019-11-16 NOTE — MAU Provider Note (Signed)
History     CSN: [id number]  Arrival date and time: 11/16/19 2221   First Provider Initiated Contact with Patient 11/16/19 2325      Chief Complaint  Patient presents with  . Abdominal Pain  . Vaginal Bleeding   Katherine Wagner Date is Wagner 24 y.o. G1P0 at [redacted]w[redacted]d who receives care at [facility].  She presents today for Abdominal Pain and Vaginal Bleeding. She states the cramping started around 5pm and is constant.  She states the cramping is still present, but is "not as bad as it was."    She states she went to the bathroom at 7 pm and noticed blood in the toilet, but no clots.  She states she went to the bathroom on two subsequent occasions, but did not see anymore blood.  She states she did not wear pad into the hospital and did not notice blood upon arrival. She states she had sex 7 days ago.  Patient endorses fetal movement and unsure if she is having contractions.  She reports some "white stuff" prior to the bleeding and denies leaking of fluid. Patient states she had Wagner UTI/Kidney infection about Wagner month ago, but has not had any other problems during this pregnancy.  Patient reports she has lower back pain that "goes down both legs."  Patient states "it feels like I need to pop it, but it won't pop."       OB History    Gravida  1   Para      Term      Preterm      AB      Living        SAB      TAB      Ectopic      Multiple      Live Births              Past Medical History:  Diagnosis Date  . Allergy   . Depression   . GERD (gastroesophageal reflux disease)   . Migraine headache without aura    Dr. Sharene Skeans  . Obesity   . OCD (obsessive compulsive disorder)   . Seizures (HCC)   . Tension headache    episodic    History reviewed. No pertinent surgical history.  Family History  Problem Relation Age of Onset  . Migraines Mother        Onset young adulthood  . Migraines Maternal Grandmother   . Colon cancer Neg Hx   . Esophageal cancer Neg  Hx   . Liver cancer Neg Hx   . Pancreatic cancer Neg Hx   . Rectal cancer Neg Hx   . Stomach cancer Neg Hx     Social History   Tobacco Use  . Smoking status: Never Smoker  . Smokeless tobacco: Never Used  Substance Use Topics  . Alcohol use: No    Alcohol/week: 0.0 standard drinks  . Drug use: No    Allergies: No Known Allergies  Medications Prior to Admission  Medication Sig Dispense Refill Last Dose  . fluvoxaMINE (LUVOX) 100 MG tablet Take 100 mg by mouth at bedtime.   11/16/2019 at Unknown time  . Prenatal Vit-Fe Fumarate-FA (PRENATAL MULTIVITAMIN) TABS tablet Take 1 tablet by mouth at bedtime.   11/16/2019 at Unknown time  . promethazine (PHENERGAN) 25 MG tablet Take 12.5-25 mg by mouth every 8 (eight) hours as needed for nausea or vomiting.    11/16/2019 at Unknown time  . phenazopyridine (PYRIDIUM)  200 MG tablet Take 1 tablet (200 mg total) by mouth 3 (three) times daily as needed for pain. (Patient not taking: Reported on 08/27/2019) 10 tablet 0   . QUDEXY XR 100 MG CS24 sprinkle capsule TAKE 1 CAPSULE BY MOUTH EVERYDAY AT BEDTIME (Patient not taking: Reported on 08/27/2019) 31 capsule 0     Review of Systems  Constitutional: Negative for chills and fever.  Respiratory: Negative for cough and shortness of breath.   Gastrointestinal: Positive for abdominal pain and vomiting ("Threw up once today"). Negative for constipation, diarrhea and nausea. Anal bleeding: Cramping.  Genitourinary: Positive for pelvic pain, vaginal bleeding and vaginal discharge. Negative for difficulty urinating and dysuria.  Musculoskeletal: Positive for back pain.  Neurological: Negative for dizziness, light-headedness and headaches.   Physical Exam   Blood pressure 105/60, pulse 91, temperature 99.2 F (37.3 C), temperature source Oral, resp. rate 20, height 5\' 4"  (1.626 m), weight 77.5 kg.  Physical Exam  Constitutional: She is oriented to person, place, and time. She appears well-developed  and well-nourished. No distress.  HENT:  Head: Normocephalic and atraumatic.  Eyes: Conjunctivae are normal.  Cardiovascular: Normal rate, regular rhythm and normal heart sounds.  Respiratory: Effort normal and breath sounds normal.  GI: Soft.  Genitourinary: Cervix exhibits friability. Cervix exhibits no motion tenderness and no discharge.    No vaginal bleeding.  No bleeding in the vagina.    Genitourinary Comments: Speculum Exam: -Normal External Genitalia: Non tender, no apparent discharge or blood noted at introitus.  -Vaginal Vault: Pink mucosa with good rugae. Scant amt white discharge -wet prep collected -Cervix:Appears inflamed with varicosities that bleed with manipulation, but no lesions, cysts, or polyps.  Appears closed. No active bleeding from os-GC/CT collected  -Bimanual Exam: Dilation: Closed Effacement (%): Thick Station: Ballotable Exam by:: [email or web address] cnm    Musculoskeletal:        General: Normal range of motion.     Cervical back: Normal range of motion.  Neurological: She is alert and oriented to person, place, and time.  Skin: Skin is warm and dry.  Psychiatric: She has Wagner normal mood and affect. Her behavior is normal.    Fetal Assessment 140 bpm, Mod Var, -Decels, +10x10Accels Toco: None  MAU Course   Results for orders placed or performed during the hospital encounter of 11/16/19 (from the past 24 hour(s))  Urinalysis, Routine w reflex microscopic     Status: Abnormal   Collection Time: 11/16/19 10:39 PM  Result Value Ref Range   Color, Urine YELLOW YELLOW   APPearance CLOUDY (Wagner) CLEAR   Specific Gravity, Urine 1.013 1.005 - 1.030   pH 6.0 5.0 - 8.0   Glucose, UA NEGATIVE NEGATIVE mg/dL   Hgb urine dipstick LARGE (Wagner) NEGATIVE   Bilirubin Urine NEGATIVE NEGATIVE   Ketones, ur NEGATIVE NEGATIVE mg/dL   Protein, ur NEGATIVE NEGATIVE mg/dL   Nitrite NEGATIVE NEGATIVE   Leukocytes,Ua NEGATIVE NEGATIVE   RBC / HPF >50 (H) 0 - 5 RBC/hpf   WBC, UA  6-10 0 - 5 WBC/hpf   Bacteria, UA RARE (Wagner) NONE SEEN   Squamous Epithelial / LPF 0-5 0 - 5   Mucus PRESENT   Wet prep, genital     Status: Abnormal   Collection Time: 11/16/19 11:34 PM   Specimen: Cervix  Result Value Ref Range   Yeast Wet Prep HPF POC NONE SEEN NONE SEEN   Trich, Wet Prep NONE SEEN NONE SEEN   Clue Cells Wet Prep HPF  POC NONE SEEN NONE SEEN   WBC, Wet Prep HPF POC FEW (Wagner) NONE SEEN   Sperm NONE SEEN    No results found.  MDM PE Labs: UA, Wet Prep, GC/CT EFM  Assessment and Plan  24 year old G1P0  SIUP at [place] FT Vaginal Bleeding AB Positive  -POC reviewed -Exam performed and findings discussed. -Patient informed that bleeding likely from cervix which is friable on exam.  -Cultures collected and pending.  -Discussed how this could cause self-limited spotting and/or bleeding. -Reassured that bilateral lower back pain is Wagner common pregnancy complaint.  Discussed comfort measures including warm compresses, frequent rest, and tylenol usage.  -Patient offered medication for back pain and declines stating she will take tylenol at home. -Will await results. -NST reactive and okay to discontinue monitoring.    Maryann Conners MSN, CNM 11/16/2019, 11:25 PM   Reassessment (12:19 AM)  -Results return insignificant. -Provider in room to discuss with patient. -Patient now reports that blood did not come from her vagina, but from her urethra.  Patient reiterates that she was treated for Wagner UTI/kidney infection Wagner month ago and she thinks things have worsened. -Chart reviewed and patient informed that she did not have Wagner UTI and no kidney stones was noted on ultrasound.  Provider does acknowledge that patient was started on medication for suspected UTI prior to ED discharge. -Patient questions what she should do and provider reiterates that bleeding was most likely from cervical friability noted on exam.  However, patient instructed to monitor symptoms and report  to primary ob as appropriate. -Patient verbalized understanding and has no further q/c. -GC/CT pending. -Discharged to home in stable condition.  Maryann Conners MSN, CNM Advanced Practice Provider, [facility]

## 2019-11-17 DIAGNOSIS — O4693 Antepartum hemorrhage, unspecified, third trimester: Secondary | ICD-10-CM | POA: Diagnosis not present

## 2019-11-17 DIAGNOSIS — Z3A32 32 weeks gestation of pregnancy: Secondary | ICD-10-CM | POA: Diagnosis not present

## 2019-11-17 LAB — WET PREP, GENITAL
Clue Cells Wet Prep HPF POC: NONE SEEN
Sperm: NONE SEEN
Trich, Wet Prep: NONE SEEN
Yeast Wet Prep HPF POC: NONE SEEN

## 2019-11-17 NOTE — Discharge Instructions (Signed)
Vaginal Bleeding During Pregnancy, Third Trimester  A small amount of bleeding from the vagina (spotting) is relatively common during pregnancy. Various things can cause bleeding or spotting during pregnancy. Sometimes bleeding is normal and is not a problem. However, bleeding during the third trimester can also be a sign of something serious for the mother and the baby. Be sure to tell your health care provider about any vaginal bleeding right away. Some possible causes of vaginal bleeding during the third trimester include:  Infection or growths (polyps) on the cervix.  A condition in which the placenta partially or completely covers the opening of the cervix inside the uterus (placenta previa).  The placenta separating from the uterus (placenta abruption).  The start of labor (discharging of the mucus plug).  A condition in which the placenta grows into the muscle layer of the uterus (placenta accreta). Follow these instructions at home: Activity  Follow instructions from your health care provider about limiting your activity. If your health care provider recommends activity restriction, you may need to stay in bed and only get up to use the bathroom. In some cases, your health care provider may allow you to continue light activity.  If needed, make plans for someone to help with your regular activities.  Ask your health care provider if it is safe for you to drive.  Do not lift anything that is heavier than 10 lb (4.5 kg), or the limit that your health care provider tells you, until he or she says that it is safe.  Do not have sex or orgasms until your health care provider says that this is safe. Medicines  Take over-the-counter and prescription medicines only as told by your health care provider.  Do not take aspirin because it can cause bleeding. General instructions  Pay attention to any changes in your symptoms.  Write down how many pads you use each day, how often you  change pads, and how soaked (saturated) they are.  Do not use tampons or douche.  If you pass any tissue from your vagina, save the tissue so you can show it to your health care provider.  Keep all follow-up visits as told by your health care provider. This is important. Contact a health care provider if:  You have vaginal bleeding during any part of your pregnancy.  You have cramps or labor pains.  You have a fever. Get help right away if:  You have severe cramps or pain in your back or abdomen.  You have a gush of fluid from the vagina.  You pass large clots or a large amount of tissue from your vagina.  Your bleeding increases.  You feel light-headed or weak.  You faint.  You feel that your baby is moving less than usual, or not moving at all. Summary  Various things can cause bleeding or spotting in pregnancy.  Bleeding during the third trimester can be a sign of a serious problem for the mother and the baby.  Be sure to tell your health care provider about any vaginal bleeding right away. This information is not intended to replace advice given to you by your health care provider. Make sure you discuss any questions you have with your health care provider. Document Revised: 01/09/2019 Document Reviewed: 12/23/2016 Elsevier Patient Education  2020 Elsevier Inc.  

## 2019-11-18 LAB — CULTURE, OB URINE: Culture: <10000 — AB

## 2019-11-19 LAB — GC/CHLAMYDIA PROBE AMP (~~LOC~~) NOT AT ARMC
Chlamydia: NEGATIVE
Comment: NEGATIVE
Comment: NORMAL
Neisseria Gonorrhea: NEGATIVE

## 2019-11-30 ENCOUNTER — Other Ambulatory Visit: Payer: Self-pay

## 2019-11-30 ENCOUNTER — Inpatient Hospital Stay (HOSPITAL_COMMUNITY)
Admission: EM | Admit: 2019-11-30 | Discharge: 2019-12-01 | Disposition: A | Discharge status: Other Acute Inpatient Hospital | Payer: 59 | Attending: Emergency Medicine | Admitting: Emergency Medicine

## 2019-11-30 ENCOUNTER — Encounter (HOSPITAL_COMMUNITY): Payer: Self-pay

## 2019-11-30 DIAGNOSIS — Z3A34 34 weeks gestation of pregnancy: Secondary | ICD-10-CM | POA: Insufficient documentation

## 2019-11-30 DIAGNOSIS — R1084 Generalized abdominal pain: Secondary | ICD-10-CM

## 2019-11-30 DIAGNOSIS — O9A213 Injury, poisoning and certain other consequences of external causes complicating pregnancy, third trimester: Secondary | ICD-10-CM

## 2019-11-30 DIAGNOSIS — O26893 Other specified pregnancy related conditions, third trimester: Secondary | ICD-10-CM

## 2019-11-30 DIAGNOSIS — Z041 Encounter for examination and observation following transport accident: Secondary | ICD-10-CM | POA: Diagnosis present

## 2019-11-30 LAB — COMPREHENSIVE METABOLIC PANEL
ALT: 15 U/L (ref 0–44)
AST: 18 U/L (ref 15–41)
Albumin: 3.3 g/dL — ABNORMAL LOW (ref 3.5–5.0)
Alkaline Phosphatase: 100 U/L (ref 38–126)
Anion gap: 11 (ref 5–15)
BUN: 5 mg/dL — ABNORMAL LOW (ref 6–20)
CO2: 23 mmol/L (ref 22–32)
Calcium: 9 mg/dL (ref 8.9–10.3)
Chloride: 102 mmol/L (ref 98–111)
Creatinine, Ser: 0.57 mg/dL (ref 0.44–1.00)
GFR calc Af Amer: >60 mL/min (ref >60)
GFR calc non Af Amer: >60 mL/min (ref >60)
Glucose, Bld: 84 mg/dL (ref 70–99)
Potassium: 3.5 mmol/L (ref 3.5–5.1)
Sodium: 136 mmol/L (ref 135–145)
Total Bilirubin: 0.6 mg/dL (ref 0.3–1.2)
Total Protein: 6.3 g/dL — ABNORMAL LOW (ref 6.5–8.1)

## 2019-11-30 LAB — CBC WITH DIFFERENTIAL/PLATELET
Abs Immature Granulocytes: 0.1 10*3/uL — ABNORMAL HIGH (ref 0.00–0.07)
Basophils Absolute: 0 10*3/uL (ref 0.0–0.1)
Basophils Relative: 0 %
Eosinophils Absolute: 0.2 10*3/uL (ref 0.0–0.5)
Eosinophils Relative: 2 %
HCT: 37.9 % (ref 36.0–46.0)
Hemoglobin: 13 g/dL (ref 12.0–15.0)
Immature Granulocytes: 1 %
Lymphocytes Relative: 22 %
Lymphs Abs: 2.3 10*3/uL (ref 0.7–4.0)
MCH: 32.3 pg (ref 26.0–34.0)
MCHC: 34.3 g/dL (ref 30.0–36.0)
MCV: 94 fL (ref 80.0–100.0)
Monocytes Absolute: 1 10*3/uL (ref 0.1–1.0)
Monocytes Relative: 9 %
Neutro Abs: 7.1 10*3/uL (ref 1.7–7.7)
Neutrophils Relative %: 66 %
Platelets: 256 10*3/uL (ref 150–400)
RBC: 4.03 MIL/uL (ref 3.87–5.11)
RDW: 12.4 % (ref 11.5–15.5)
WBC: 10.8 10*3/uL — ABNORMAL HIGH (ref 4.0–10.5)
nRBC: 0 % (ref 0.0–0.2)

## 2019-11-30 LAB — LIPASE, BLOOD: Lipase: 25 U/L (ref 11–51)

## 2019-11-30 LAB — I-STAT BETA HCG BLOOD, ED (MC, WL, AP ONLY): I-stat hCG, quantitative: >2000 m[IU]/mL — ABNORMAL HIGH (ref <5)

## 2019-11-30 LAB — I-STAT CHEM 8, ED
BUN: 4 mg/dL — ABNORMAL LOW (ref 6–20)
Calcium, Ion: 1.2 mmol/L (ref 1.15–1.40)
Chloride: 103 mmol/L (ref 98–111)
Creatinine, Ser: 0.5 mg/dL (ref 0.44–1.00)
Glucose, Bld: 76 mg/dL (ref 70–99)
HCT: 37 % (ref 36.0–46.0)
Hemoglobin: 12.6 g/dL (ref 12.0–15.0)
Potassium: 3.6 mmol/L (ref 3.5–5.1)
Sodium: 136 mmol/L (ref 135–145)
TCO2: 26 mmol/L (ref 22–32)

## 2019-11-30 LAB — LACTIC ACID, PLASMA: Lactic Acid, Venous: 0.9 mmol/L (ref 0.5–1.9)

## 2019-11-30 MED ORDER — SODIUM CHLORIDE 0.9 % IV BOLUS
1000.0000 mL | Freq: Once | INTRAVENOUS | Status: AC
  Administered 2019-11-30: 21:00:00 1000 mL via INTRAVENOUS
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
## 2019-12-01 ENCOUNTER — Inpatient Hospital Stay (HOSPITAL_BASED_OUTPATIENT_CLINIC_OR_DEPARTMENT_OTHER): Payer: 59

## 2019-12-01 DIAGNOSIS — Z3A34 34 weeks gestation of pregnancy: Secondary | ICD-10-CM | POA: Diagnosis not present

## 2019-12-01 DIAGNOSIS — O9A213 Injury, poisoning and certain other consequences of external causes complicating pregnancy, third trimester: Secondary | ICD-10-CM

## 2019-12-01 DIAGNOSIS — T1490XA Injury, unspecified, initial encounter: Secondary | ICD-10-CM

## 2019-12-01 LAB — LACTIC ACID, PLASMA: Lactic Acid, Venous: 0.7 mmol/L (ref 0.5–1.9)

## 2019-12-01 LAB — ABO/RH: ABO/RH(D): AB POS

## 2019-12-01 MED ORDER — CYCLOBENZAPRINE HCL 10 MG PO TABS: 5.0000 mg | ORAL_TABLET | Freq: Three times a day (TID) | ORAL | 2 refills | Status: DC | PRN

## 2019-12-03 ENCOUNTER — Encounter (HOSPITAL_COMMUNITY): Payer: Self-pay | Admitting: Obstetrics and Gynecology

## 2019-12-18 LAB — OB RESULTS CONSOLE GBS: GBS: NEGATIVE

## 2019-12-31 ENCOUNTER — Encounter (HOSPITAL_COMMUNITY): Payer: Self-pay | Admitting: *Deleted

## 2019-12-31 ENCOUNTER — Telehealth (HOSPITAL_COMMUNITY): Payer: Self-pay | Admitting: *Deleted

## 2019-12-31 NOTE — Telephone Encounter (Signed)
Preadmission screen  

## 2020-01-05 ENCOUNTER — Inpatient Hospital Stay (HOSPITAL_COMMUNITY): Payer: 59 | Admitting: Anesthesiology

## 2020-01-05 ENCOUNTER — Inpatient Hospital Stay (HOSPITAL_COMMUNITY)
Admission: AD | Admit: 2020-01-05 | Discharge: 2020-01-06 | DRG: 807 | Disposition: A | Discharge status: Home / Self Care | Payer: 59 | Attending: Obstetrics and Gynecology | Admitting: Obstetrics and Gynecology

## 2020-01-05 ENCOUNTER — Other Ambulatory Visit: Payer: Self-pay

## 2020-01-05 ENCOUNTER — Encounter (HOSPITAL_COMMUNITY): Payer: Self-pay | Admitting: Obstetrics and Gynecology

## 2020-01-05 DIAGNOSIS — M79652 Pain in left thigh: Secondary | ICD-10-CM | POA: Diagnosis not present

## 2020-01-05 DIAGNOSIS — O99893 Other specified diseases and conditions complicating puerperium: Secondary | ICD-10-CM | POA: Diagnosis not present

## 2020-01-05 DIAGNOSIS — O99214 Obesity complicating childbirth: Principal | ICD-10-CM | POA: Diagnosis present

## 2020-01-05 DIAGNOSIS — E669 Obesity, unspecified: Secondary | ICD-10-CM | POA: Diagnosis present

## 2020-01-05 DIAGNOSIS — S86912A Strain of unspecified muscle(s) and tendon(s) at lower leg level, left leg, initial encounter: Secondary | ICD-10-CM | POA: Diagnosis not present

## 2020-01-05 DIAGNOSIS — Z20822 Contact with and (suspected) exposure to covid-19: Secondary | ICD-10-CM | POA: Diagnosis present

## 2020-01-05 DIAGNOSIS — Z3A39 39 weeks gestation of pregnancy: Secondary | ICD-10-CM

## 2020-01-05 DIAGNOSIS — O26893 Other specified pregnancy related conditions, third trimester: Secondary | ICD-10-CM | POA: Diagnosis present

## 2020-01-05 LAB — RESPIRATORY PANEL BY RT PCR (FLU A&B, COVID)
Influenza A by PCR: NEGATIVE
Influenza B by PCR: NEGATIVE
SARS Coronavirus 2 by RT PCR: NEGATIVE

## 2020-01-05 LAB — CBC
HCT: 37.5 % (ref 36.0–46.0)
Hemoglobin: 12.7 g/dL (ref 12.0–15.0)
MCH: 31.7 pg (ref 26.0–34.0)
MCHC: 33.9 g/dL (ref 30.0–36.0)
MCV: 93.5 fL (ref 80.0–100.0)
Platelets: 294 10*3/uL (ref 150–400)
RBC: 4.01 MIL/uL (ref 3.87–5.11)
RDW: 12.6 % (ref 11.5–15.5)
WBC: 14.2 10*3/uL — ABNORMAL HIGH (ref 4.0–10.5)
nRBC: 0 % (ref 0.0–0.2)

## 2020-01-05 LAB — TYPE AND SCREEN
ABO/RH(D): AB POS
Antibody Screen: NEGATIVE

## 2020-01-05 LAB — ABO/RH: ABO/RH(D): AB POS

## 2020-01-05 LAB — RPR: RPR Ser Ql: NONREACTIVE

## 2020-01-05 MED ORDER — MEDROXYPROGESTERONE ACETATE 150 MG/ML IM SUSP: 150.0000 mg | INTRAMUSCULAR | Status: DC | PRN

## 2020-01-05 MED ORDER — OXYTOCIN BOLUS FROM INFUSION
500.0000 mL | Freq: Once | INTRAVENOUS | Status: AC
  Administered 2020-01-05: 500 mL via INTRAVENOUS

## 2020-01-05 MED ORDER — DIBUCAINE (PERIANAL) 1 % EX OINT: 1.0000 "application " | TOPICAL_OINTMENT | CUTANEOUS | Status: DC | PRN

## 2020-01-05 MED ORDER — DIPHENHYDRAMINE HCL 50 MG/ML IJ SOLN: 12.5000 mg | INTRAMUSCULAR | Status: DC | PRN

## 2020-01-05 MED ORDER — SENNOSIDES-DOCUSATE SODIUM 8.6-50 MG PO TABS
2.0000 | ORAL_TABLET | ORAL | Status: DC
  Administered 2020-01-05: 2 via ORAL
  Filled 2020-01-05: qty 2

## 2020-01-05 MED ORDER — ACETAMINOPHEN 325 MG PO TABS: 650.0000 mg | ORAL_TABLET | ORAL | Status: DC | PRN

## 2020-01-05 MED ORDER — PHENYLEPHRINE 40 MCG/ML (10ML) SYRINGE FOR IV PUSH (FOR BLOOD PRESSURE SUPPORT)
80.0000 ug | PREFILLED_SYRINGE | INTRAVENOUS | Status: DC | PRN
  Filled 2020-01-05: qty 10

## 2020-01-05 MED ORDER — LIDOCAINE HCL (PF) 1 % IJ SOLN
INTRAMUSCULAR | Status: DC | PRN
  Administered 2020-01-05: 10 mL via EPIDURAL
  Administered 2020-01-05: 2 mL via EPIDURAL

## 2020-01-05 MED ORDER — FLUVOXAMINE MALEATE 100 MG PO TABS
100.0000 mg | ORAL_TABLET | Freq: Every day | ORAL | Status: DC
  Administered 2020-01-05: 100 mg via ORAL
  Filled 2020-01-05 (×2): qty 1

## 2020-01-05 MED ORDER — EPHEDRINE 5 MG/ML INJ: 10.0000 mg | INTRAVENOUS | Status: DC | PRN

## 2020-01-05 MED ORDER — PHENYLEPHRINE 40 MCG/ML (10ML) SYRINGE FOR IV PUSH (FOR BLOOD PRESSURE SUPPORT)
80.0000 ug | PREFILLED_SYRINGE | INTRAVENOUS | Status: AC | PRN
  Administered 2020-01-05 (×3): 80 ug via INTRAVENOUS

## 2020-01-05 MED ORDER — MEASLES, MUMPS & RUBELLA VAC IJ SOLR: 0.5000 mL | Freq: Once | INTRAMUSCULAR | Status: DC

## 2020-01-05 MED ORDER — WITCH HAZEL-GLYCERIN EX PADS: 1.0000 "application " | MEDICATED_PAD | CUTANEOUS | Status: DC | PRN

## 2020-01-05 MED ORDER — DIPHENHYDRAMINE HCL 25 MG PO CAPS: 25.0000 mg | ORAL_CAPSULE | Freq: Four times a day (QID) | ORAL | Status: DC | PRN

## 2020-01-05 MED ORDER — PRENATAL MULTIVITAMIN CH
1.0000 | ORAL_TABLET | Freq: Every day | ORAL | Status: DC
  Administered 2020-01-06: 1 via ORAL
  Filled 2020-01-05: qty 1

## 2020-01-05 MED ORDER — COCONUT OIL OIL: 1.0000 "application " | TOPICAL_OIL | Status: DC | PRN

## 2020-01-05 MED ORDER — LACTATED RINGERS IV SOLN
500.0000 mL | INTRAVENOUS | Status: DC | PRN
  Administered 2020-01-05 (×2): 500 mL via INTRAVENOUS

## 2020-01-05 MED ORDER — IBUPROFEN 600 MG PO TABS
600.0000 mg | ORAL_TABLET | Freq: Four times a day (QID) | ORAL | Status: DC
  Administered 2020-01-05 – 2020-01-06 (×2): 600 mg via ORAL
  Filled 2020-01-05 (×3): qty 1

## 2020-01-05 MED ORDER — FLEET ENEMA 7-19 GM/118ML RE ENEM: 1.0000 | ENEMA | RECTAL | Status: DC | PRN

## 2020-01-05 MED ORDER — BUTORPHANOL TARTRATE 1 MG/ML IJ SOLN
1.0000 mg | INTRAMUSCULAR | Status: DC | PRN
  Administered 2020-01-05: 1 mg via INTRAVENOUS
  Filled 2020-01-05: qty 1

## 2020-01-05 MED ORDER — OXYCODONE-ACETAMINOPHEN 5-325 MG PO TABS: 2.0000 | ORAL_TABLET | ORAL | Status: DC | PRN

## 2020-01-05 MED ORDER — OXYTOCIN 40 UNITS IN NORMAL SALINE INFUSION - SIMPLE MED
2.5000 [IU]/h | INTRAVENOUS | Status: DC
  Administered 2020-01-05: 2.5 [IU]/h via INTRAVENOUS
  Filled 2020-01-05: qty 1000

## 2020-01-05 MED ORDER — ONDANSETRON HCL 4 MG/2ML IJ SOLN: 4.0000 mg | INTRAMUSCULAR | Status: DC | PRN

## 2020-01-05 MED ORDER — ZOLPIDEM TARTRATE 5 MG PO TABS: 5.0000 mg | ORAL_TABLET | Freq: Every evening | ORAL | Status: DC | PRN

## 2020-01-05 MED ORDER — FENTANYL-BUPIVACAINE-NACL 0.5-0.125-0.9 MG/250ML-% EP SOLN: 12.0000 mL/h | EPIDURAL | Status: DC | PRN

## 2020-01-05 MED ORDER — ONDANSETRON HCL 4 MG PO TABS: 4.0000 mg | ORAL_TABLET | ORAL | Status: DC | PRN

## 2020-01-05 MED ORDER — LACTATED RINGERS IV SOLN: 500.0000 mL | Freq: Once | INTRAVENOUS | Status: DC

## 2020-01-05 MED ORDER — LIDOCAINE HCL (PF) 1 % IJ SOLN: 30.0000 mL | INTRAMUSCULAR | Status: DC | PRN

## 2020-01-05 MED ORDER — OXYCODONE-ACETAMINOPHEN 5-325 MG PO TABS: 1.0000 | ORAL_TABLET | ORAL | Status: DC | PRN

## 2020-01-05 MED ORDER — SIMETHICONE 80 MG PO CHEW
80.0000 mg | CHEWABLE_TABLET | ORAL | Status: DC | PRN
  Administered 2020-01-05: 80 mg via ORAL
  Filled 2020-01-05: qty 1

## 2020-01-05 MED ORDER — OXYTOCIN 40 UNITS IN NORMAL SALINE INFUSION - SIMPLE MED
1.0000 m[IU]/min | INTRAVENOUS | Status: DC
  Administered 2020-01-05: 2 m[IU]/min via INTRAVENOUS

## 2020-01-05 MED ORDER — LACTATED RINGERS IV SOLN
INTRAVENOUS | Status: DC
  Administered 2020-01-05: 125 mL/h via INTRAVENOUS

## 2020-01-05 MED ORDER — ONDANSETRON HCL 4 MG/2ML IJ SOLN: 4.0000 mg | Freq: Four times a day (QID) | INTRAMUSCULAR | Status: DC | PRN

## 2020-01-05 MED ORDER — TERBUTALINE SULFATE 1 MG/ML IJ SOLN: 0.2500 mg | Freq: Once | INTRAMUSCULAR | Status: DC | PRN

## 2020-01-05 MED ORDER — TETANUS-DIPHTH-ACELL PERTUSSIS 5-2.5-18.5 LF-MCG/0.5 IM SUSP: 0.5000 mL | Freq: Once | INTRAMUSCULAR | Status: DC

## 2020-01-05 MED ORDER — BENZOCAINE-MENTHOL 20-0.5 % EX AERO
1.0000 "application " | INHALATION_SPRAY | CUTANEOUS | Status: DC | PRN
  Administered 2020-01-06: 1 via TOPICAL
  Filled 2020-01-05: qty 56

## 2020-01-05 MED ORDER — SODIUM CHLORIDE (PF) 0.9 % IJ SOLN
INTRAMUSCULAR | Status: DC | PRN
  Administered 2020-01-05: 12 mL/h via EPIDURAL

## 2020-01-05 MED ORDER — SOD CITRATE-CITRIC ACID 500-334 MG/5ML PO SOLN: 30.0000 mL | ORAL | Status: DC | PRN

## 2020-01-05 MED ORDER — OXYCODONE-ACETAMINOPHEN 5-325 MG PO TABS
2.0000 | ORAL_TABLET | ORAL | Status: DC | PRN
  Administered 2020-01-05: 2 via ORAL
  Filled 2020-01-05: qty 2

## 2020-01-05 MED ORDER — IBUPROFEN 600 MG PO TABS
600.0000 mg | ORAL_TABLET | Freq: Four times a day (QID) | ORAL | Status: DC | PRN
  Administered 2020-01-05 – 2020-01-06 (×2): 600 mg via ORAL
  Filled 2020-01-05: qty 1

## 2020-01-05 MED ORDER — FENTANYL-BUPIVACAINE-NACL 0.5-0.125-0.9 MG/250ML-% EP SOLN
EPIDURAL | Status: AC
  Filled 2020-01-05: qty 250

## 2020-01-05 MED ORDER — OXYCODONE-ACETAMINOPHEN 5-325 MG PO TABS
1.0000 | ORAL_TABLET | ORAL | Status: DC | PRN
  Administered 2020-01-06: 1 via ORAL
  Filled 2020-01-05: qty 1

## 2020-01-05 NOTE — Anesthesia Preprocedure Evaluation (Addendum)
Anesthesia Evaluation  Patient identified by MRN, date of birth, ID band Patient awake    Reviewed: Allergy & Precautions, Patient's Chart, lab work & pertinent test results  Airway Mallampati: II  TM Distance: >3 FB Neck ROM: Full    Dental no notable dental hx. (+) Teeth Intact   Pulmonary neg pulmonary ROS,    Pulmonary exam normal breath sounds clear to auscultation       Cardiovascular negative cardio ROS Normal cardiovascular exam Rhythm:Regular Rate:Normal     Neuro/Psych  Headaches, Seizures -, Well Controlled,  PSYCHIATRIC DISORDERS Anxiety Depression 69mm cerebellar tonsillar ectopia. 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 related to headaches and seizures  Seizures- no meds, has not had seizure since 24yo    GI/Hepatic Neg liver ROS, GERD  ,  Endo/Other  Obesity BMI 31  Renal/GU negative Renal ROS  negative genitourinary   Musculoskeletal negative musculoskeletal ROS (+)   Abdominal (+) + obese,   Peds  Hematology negative hematology ROS (+)   Anesthesia Other Findings   Reproductive/Obstetrics (+) Pregnancy                           Anesthesia Physical Anesthesia Plan  ASA: III and emergent  Anesthesia Plan: Epidural   Post-op Pain Management:    Induction:   PONV Risk Score and Plan: Treatment may vary due to age or medical condition  Airway Management Planned: Natural Airway  Additional Equipment:   Intra-op Plan:   Post-operative Plan:   Informed Consent: I have reviewed the patients History and Physical, chart, labs and discussed the procedure including the risks, benefits and alternatives for the proposed anesthesia with the patient or authorized representative who has indicated his/her understanding and acceptance.       Plan Discussed with: Anesthesiologist  Anesthesia Plan Comments: (Patient and boyfriend counseled extensively on the  increased risk associated with the patient's cerebellar tonsillar herniation in the case of accidental dural puncture. Patient decision to proceed with epidural. )       Anesthesia Quick Evaluation

## 2020-01-05 NOTE — Anesthesia Procedure Notes (Signed)
Epidural Patient location during procedure: OB Start time: 01/05/2020 6:56 AM End time: 01/05/2020 7:12 AM  Staffing Anesthesiologist: Lannie Fields, DO Performed: anesthesiologist   Preanesthetic Checklist Completed: patient identified, IV checked, risks and benefits discussed, monitors and equipment checked, pre-op evaluation and timeout performed  Epidural Patient position: sitting Prep: DuraPrep and site prepped and draped Patient monitoring: continuous pulse ox, blood pressure, heart rate and cardiac monitor Approach: midline Location: L3-L4 Injection technique: LOR air  Needle:  Needle type: Tuohy  Needle gauge: 17 G Needle length: 9 cm Needle insertion depth: 7 cm Catheter type: closed end flexible Catheter size: 19 Gauge Catheter at skin depth: 13 cm Test dose: negative  Assessment Sensory level: T8 Events: blood not aspirated, injection not painful, no injection resistance, no paresthesia and negative IV test  Additional Notes Patient identified. Risks/Benefits/Options discussed with patient including but not limited to bleeding, infection, nerve damage, paralysis, failed block, incomplete pain control, headache, blood pressure changes, nausea, vomiting, reactions to medication both or allergic, itching and postpartum back pain. Confirmed with bedside nurse the patient's most recent platelet count. Confirmed with patient that they are not currently taking any anticoagulation, have any bleeding history or any family history of bleeding disorders. Patient expressed understanding and wished to proceed. All questions were answered. Sterile technique was used throughout the entire procedure. Please see nursing notes for vital signs. Test dose was given through epidural catheter and negative prior to continuing to dose epidural or start infusion. Warning signs of high block given to the patient including shortness of breath, tingling/numbness in hands, complete motor block,  or any concerning symptoms with instructions to call for help. Patient was given instructions on fall risk and not to get out of bed. All questions and concerns addressed with instructions to call with any issues or inadequate analgesia.  Reason for block:procedure for pain

## 2020-01-05 NOTE — MAU Note (Signed)
Contractions every 4 min for last 2-3 hours. Was 3 cm at last office appt. No leaking. Bloody mucus.  Baby not moving as much as usual today.

## 2020-01-05 NOTE — H&P (Signed)
Katherine Wagner is a 24 y.o. female presenting for labor.  Now with epidural and complete.  Pregnancy uncomplicated.  GBS-. OB History    Gravida  1   Para  0   Term  0   Preterm  0   AB  0   Living        SAB  0   TAB  0   Ectopic  0   Multiple      Live Births             Past Medical History:  Diagnosis Date  . Allergy   . Depression   . GERD (gastroesophageal reflux disease)   . Migraine headache without aura    Dr. Sharene Skeans  . Obesity   . OCD (obsessive compulsive disorder)   . Seizures (HCC)   . Tension headache    episodic   Past Surgical History:  Procedure Laterality Date  . NO PAST SURGERIES    . WISDOM TOOTH EXTRACTION     Family History: family history includes Migraines in her maternal grandmother and mother. Social History:  reports that she has never smoked. She has never used smokeless tobacco. She reports that she does not drink alcohol or use drugs.     Maternal Diabetes: No Genetic Screening: Normal Maternal Ultrasounds/Referrals: Normal Fetal Ultrasounds or other Referrals:  None Maternal Substance Abuse:  No Significant Maternal Medications:  None Significant Maternal Lab Results:  Group B Strep negative Other Comments:  None  Review of Systems History Dilation: 10 Effacement (%): 90 Station: -1 Exam by:: MD Rana Snare Blood pressure (!) 79/41, pulse 84, temperature 98.8 F (37.1 C), temperature source Oral, resp. rate 16, height 5\' 4"  (1.626 m), weight 82.1 kg, SpO2 100 %. Exam Physical Exam  Prenatal labs: ABO, Rh: --/--/AB POS, AB POS Performed at [facility], [address] [place], [place]  [phone]/03 0139) Antibody: NEG (04/03 0139) Rubella: Immune (08/26 0000) RPR: Nonreactive (08/26 0000)  HBsAg: Negative (08/26 0000)  HIV: Non-reactive (08/26 0000)  GBS: Negative/-- (03/16 0000)   Assessment/Plan: IUP at term Active labor now second stage. Anticipate SVD   08-09-1982 01/05/2020, 9:45  AM

## 2020-01-06 LAB — CBC
HCT: 31.4 % — ABNORMAL LOW (ref 36.0–46.0)
Hemoglobin: 10.5 g/dL — ABNORMAL LOW (ref 12.0–15.0)
MCH: 31.5 pg (ref 26.0–34.0)
MCHC: 33.4 g/dL (ref 30.0–36.0)
MCV: 94.3 fL (ref 80.0–100.0)
Platelets: 236 10*3/uL (ref 150–400)
RBC: 3.33 MIL/uL — ABNORMAL LOW (ref 3.87–5.11)
RDW: 12.7 % (ref 11.5–15.5)
WBC: 11.8 10*3/uL — ABNORMAL HIGH (ref 4.0–10.5)
nRBC: 0 % (ref 0.0–0.2)

## 2020-01-06 MED ORDER — CYCLOBENZAPRINE HCL 10 MG PO TABS
10.0000 mg | ORAL_TABLET | Freq: Three times a day (TID) | ORAL | Status: DC | PRN
  Administered 2020-01-06: 10 mg via ORAL
  Filled 2020-01-06 (×2): qty 1

## 2020-01-06 MED ORDER — IBUPROFEN 600 MG PO TABS: 600.0000 mg | ORAL_TABLET | Freq: Four times a day (QID) | ORAL | 0 refills | Status: DC | PRN

## 2020-01-06 MED ORDER — OXYCODONE-ACETAMINOPHEN 5-325 MG PO TABS: 1.0000 | ORAL_TABLET | Freq: Four times a day (QID) | ORAL | 0 refills | Status: DC | PRN

## 2020-01-06 MED ORDER — CYCLOBENZAPRINE HCL 10 MG PO TABS: 5.0000 mg | ORAL_TABLET | Freq: Three times a day (TID) | ORAL | 2 refills | Status: DC | PRN

## 2020-01-06 NOTE — Evaluation (Signed)
Physical Therapy Evaluation Patient Details Name: Katherine Wagner MRN: [id number] DOB: May 11, 1996 Today's Date: 01/06/2020   History of Present Illness  Katherine Wagner presents with left inner thigh pain with movement > at rest post-partum. Pt states she also occasionally had this pain towards the end of her pregnancy but now feels she is unable to ambulate with the pain. Per Dr. Salvadore Farber with Anesthesiology, She likely stretched her obturator nerve during the 3rd trimester and exacerbated this during the delivery, and The vast majority of these injuries will heal on their own  Clinical Impression   Pt admitted with above diagnosis. Comes from home, where she has help from her mother and her boyfriend at discharge; single level home with 4 steps to enter; Presents to PT with L inner thigh pain that limits her functional mobility; Able to use the RW well to unweigh painful LE in stance; Will follow her while she is inpatient;  Pt currently with functional limitations due to the deficits listed below (see PT Problem List). Pt will benefit from skilled PT to increase their independence and safety with mobility to allow discharge to the venue listed below.       Follow Up Recommendations Outpatient PT;Other (comment)(Consider a PT specialist in Women's Health if slow to heal; The potential need for Outpatient PT can be addressed at her OB  follow-up appointments. )    Equipment Recommendations  Rolling walker with 5" wheels    Recommendations for Other Services       Precautions / Restrictions Precautions Precautions: Fall Precaution Comments: Fall risk significantly decr with use of RW Restrictions Weight Bearing Restrictions: No      Mobility  Bed Mobility               General bed mobility comments: OOB in chair upon arrival; Demonstrated use of belt to assist moving RLE, and use of LLE to support RLE coming off or getting onto bed  Transfers Overall transfer level: Needs  assistance Equipment used: Rolling walker (2 wheeled) Transfers: Sit to/from Stand Sit to Stand: Supervision         General transfer comment: Demostrated technqiue for hand placement; good rise  Ambulation/Gait Ambulation/Gait assistance: Supervision Gait Distance (Feet): 100 Feet Assistive device: Rolling walker (2 wheeled) Gait Pattern/deviations: Step-through pattern;Decreased step length - right     General Gait Details: Good use of RW for support; slow steps, but smooth, and Katherine Wagner reported feeling more confident walking with RW  Stairs         General stair comments: Discussed technique and sequence for going up steps; Katherine Wagner indiecated her boyfriend can assist her  Wheelchair Mobility    Modified Rankin (Stroke Patients Only)       Balance Overall balance assessment: No apparent balance deficits (not formally assessed)                                           Pertinent Vitals/Pain Pain Assessment: 0-10 Pain Score: 3  Pain Location: R inner thigh with movement Pain Descriptors / Indicators: Sharp Pain Intervention(s): Monitored during session;Premedicated before session    Home Living Family/patient expects to be discharged to:: Private residence Living Arrangements: Spouse/significant other Available Help at Discharge: Family Type of Home: House Home Access: Stairs to enter Entrance Stairs-Rails: None Entrance Stairs-Number of Steps: 4 Home Layout: One level   Additional Comments: Reports  she has a shower seat for the option of sitting in the shower    Prior Function Level of Independence: Independent               Hand Dominance        Extremity/Trunk Assessment   Upper Extremity Assessment Upper Extremity Assessment: Overall WFL for tasks assessed    Lower Extremity Assessment Lower Extremity Assessment: RLE deficits/detail RLE Deficits / Details: Limited Hip ROM due to pain; pain is worse with  abduction movements       Communication   Communication: No difficulties  Cognition Arousal/Alertness: Awake/alert Behavior During Therapy: WFL for tasks assessed/performed Overall Cognitive Status: Within Functional Limits for tasks assessed                                        General Comments      Exercises     Assessment/Plan    PT Assessment Patient needs continued PT services  PT Problem List Decreased strength;Decreased range of motion;Pain       PT Treatment Interventions DME instruction;Gait training;Stair training;Functional mobility training;Therapeutic activities;Therapeutic exercise;Balance training;Patient/family education    PT Goals (Current goals can be found in the Care Plan section)  Acute Rehab PT Goals Patient Stated Goal: Hopes to be home soon PT Goal Formulation: With patient Time For Goal Achievement: 01/13/20 Potential to Achieve Goals: Good    Frequency Min 5X/week   Barriers to discharge        Co-evaluation               AM-PAC PT "6 Clicks" Mobility  Outcome Measure Help needed turning from your back to your side while in a flat bed without using bedrails?: A Little Help needed moving from lying on your back to sitting on the side of a flat bed without using bedrails?: A Little Help needed moving to and from a bed to a chair (including a wheelchair)?: A Little Help needed standing up from a chair using your arms (e.g., wheelchair or bedside chair)?: None Help needed to walk in hospital room?: None Help needed climbing 3-5 steps with a railing? : A Little 6 Click Score: 20    End of Session   Activity Tolerance: Patient tolerated treatment well Patient left: in chair Nurse Communication: Mobility status;Other (comment)(rEcommend rW) PT Visit Diagnosis: Pain Pain - Right/Left: Right Pain - part of body: (inner thigh)    Time: 1445-1520 PT Time Calculation (min) (ACUTE ONLY): 35 min   Charges:   PT  Evaluation $PT Eval Low Complexity: 1 Low PT Treatments $Gait Training: 8-22 mins        Roney Marion, PT  Acute Rehabilitation Services Pager [phone] Office [place] 01/06/2020, 3:42 PM

## 2020-01-06 NOTE — Care Management (Signed)
Patient provided with RW for home use.

## 2020-01-06 NOTE — Progress Notes (Signed)
Evaluated patient for left inner thigh pain with movement > at rest post-partum. Pt states she also occasionally had this pain towards the end of her pregnancy but now feels she is unable to ambulate with the pain. Her epidural was an easy placement and worked well throughout the labor, however additional PCEA doses were given just prior to pushing and as such her legs were significantly numb for positioning in the stirrups. She likely stretched her obturator nerve during the 3rd trimester and exacerbated this during the delivery. I counseled the patient on rest, tylenol and NSAIDs and potentially trying a muscle relaxant. The vast majority of these injuries will heal on their own. A blood clot would be extremely unlikely in a patient who was vasodilated 2/2 epidural and the location of the pain would also make a blood clot very unlikely. I do not think a d dimer would be a high yield test at this point, as it will very likely be elevated in the setting of recent delivery.  Pt also c/o tenderness at the site of epidural insertion. Site of epidural looks completely normal, no erythema or edema. Counseled the patient that this site may be tender to touch for the coming 1-2 weeks.  Please call with further questions.

## 2020-01-06 NOTE — Discharge Summary (Signed)
Obstetric Discharge Summary Reason for Admission: onset of labor Prenatal Procedures: none Intrapartum Procedures: spontaneous vaginal delivery Postpartum Procedures: none Complications-Operative and Postpartum: none and left leg strain Hemoglobin  Date Value Ref Range Status  01/06/2020 10.5 (L) 12.0 - 15.0 g/dL Final   HCT  Date Value Ref Range Status  01/06/2020 31.4 (L) 36.0 - 46.0 % Final    Physical Exam:  General: alert, cooperative, appears stated age and mild distress Lochia: appropriate Uterine Fundus: firm Incision: healing well DVT Evaluation: No evidence of DVT seen on physical exam.  Discharge Diagnoses: Term Pregnancy-delivered  Discharge Information: Date: 01/06/2020 Activity: pelvic rest Diet: routine Medications: PNV, Percocet, Motrin, Flexeril Condition: stable Instructions: refer to practice specific booklet Discharge to: home   Newborn Data: Live born female  Birth Weight: 6 lb 10.2 oz (3010 g) APGAR: 10, 10  Newborn Delivery   Birth date/time: 01/05/2020 11:23:00 Delivery type: Vaginal, Spontaneous      Home with mother.  Katherine Wagner 01/06/2020, 2:35 PM

## 2020-01-06 NOTE — Progress Notes (Signed)
CSW received consult for hx of Anxiety, Depression, and OCD.    CSW met with MOB to offer support and complete assessment. FOB and infant, Layla were present at time of visit however, after PPD and SIDS education, FOB stepped out of room to offer MOB privacy during assessment. MOB and FOB were pleasant and engaged during visit.   MOB report hx of depression, anxiety, and OCD since age of 24 years old. MOB reported sx have been well managed with therapy and medication for several years. MOB has an active prescription Fluvoxamine that MOB confirms taking daily. MOB reported main sx as rumination, however, strategies such as talking through things with loved ones helps. MOB identified FOB and mom as support. MOB denied any SI, HI, or domestic violence.    CSW provided education regarding the baby blues period vs. perinatal mood disorders, discussed treatment and gave resources for mental health follow up if concerns arise.  CSW recommends self-evaluation during the postpartum time period using the New Mom Checklist from Postpartum Progress and encouraged MOB and FOB to contact a medical professional if symptoms are noted at any time. MOB and FOB asked appropriate questions and denied any additional questions.   CSW provided review of Sudden Infant Death Syndrome (SIDS) precautions.  MOB and FOB confirmed having all needed items for baby including  New car seat, and crib and bassinet for baby's safe sleeping area.   CSW identifies no further need for intervention and no barriers to discharge at this time.  Shiva Sahagian D. Jaslene Marsteller, MSW, LCSWA Clinical Social Worker [phone] 

## 2020-01-06 NOTE — Progress Notes (Signed)
Post Partum Day 1 Subjective: voiding, tolerating PO and + flatus  Pt complaints of left leg pain with walking in the inner upper thigh.  Causing her to not bend her knee when walking.  She and the FOB are concerned when she gets home she may fall.  Yesterday tried motrin with some relief, but percocet was too sedating.  Apparently, she had some of this pain for the last 2 weeks, but more severe now after delivery.  The FOB is concerned about blood clots (since he had a blood clot when he was a wrestler that was treated).    Objective: Blood pressure 105/67, pulse 91, temperature 98 F (36.7 C), temperature source Oral, resp. rate 18, height 5\' 4"  (1.626 m), weight 82.1 kg, SpO2 100 %, unknown if currently breastfeeding.  Physical Exam:  General: alert, cooperative, appears stated age and no distress Lochia: appropriate Uterine Fundus: firm Incision: healing well DVT Evaluation: No evidence of DVT seen on physical exam. Her left leg appears normal.  Neg homans and normal sensation and DTRs 2/4 bilaterally.  The area of concern left upper inner thigh is non tender to palpation  Recent Labs    01/05/20 0143 01/06/20 0543  HGB 12.7 10.5*  HCT 37.5 31.4*    Assessment/Plan: PPD day 1 doing well from a delivery standpoint.  Left leg eval - exam and hx most c/w musculoskeletal strain. Encouraged NSAIDs and will have PT evaluate and make recommendations.  I spoke with Anesthesia and they will evaluate.  D Dimer and labs would be low yield and false positive high due to recent delivery. Could consider doppler.   LOS: 1 day   03/07/20 01/06/2020, 9:44 AM

## 2020-01-06 NOTE — Anesthesia Postprocedure Evaluation (Signed)
Anesthesia Post Note  Patient: Ronnae A Berquist  Procedure(s) Performed: AN AD HOC LABOR EPIDURAL     Patient location during evaluation: Mother Baby Anesthesia Type: Epidural Level of consciousness: awake and alert, oriented and patient cooperative Pain management: pain level controlled Vital Signs Assessment: post-procedure vital signs reviewed and stable Respiratory status: spontaneous breathing Cardiovascular status: stable Postop Assessment: no headache, epidural receding, patient able to bend at knees and no signs of nausea or vomiting Anesthetic complications: no Comments: Pt. Interviewed via phone.  States she is bending right leg 90 degrees.  States her left leg/thigh is hurting and preventing her from moving her leg.  States leg was hurting last several weeks of pregnancy.  Pain score 4.  Dr. Armond Hang MD notified and followed up with patient's nurse after call.     Last Vitals:  Vitals:   01/05/20 2231 01/06/20 0558  BP: 115/73 105/67  Pulse: 96 91  Resp: 18 18  Temp: 36.8 C 36.7 C  SpO2:  100%    Last Pain:  Vitals:   01/06/20 0558  TempSrc: Oral  PainSc: 8    Pain Goal: Patients Stated Pain Goal: 0 (01/05/20 0043)                 Merrilyn Puma

## 2020-01-08 ENCOUNTER — Other Ambulatory Visit (HOSPITAL_COMMUNITY): Payer: 59

## 2020-01-10 ENCOUNTER — Inpatient Hospital Stay (HOSPITAL_COMMUNITY): Admission: AD | Admit: 2020-01-10 | Payer: 59 | Source: Home / Self Care | Admitting: Obstetrics and Gynecology

## 2020-01-10 ENCOUNTER — Inpatient Hospital Stay (HOSPITAL_COMMUNITY): Payer: 59

## 2020-03-05 IMAGING — CT CT HEAD W/O CM
4 series · 16 of 47 positions shown, 18 images · non-contrast
Comparison: 04/03/2004 CT head.

CLINICAL DATA: 22 y/o F; head injury 2 weeks ago. Dizziness
beginning 3 days ago with pain.

EXAM:
CT HEAD WITHOUT CONTRAST
TECHNIQUE: Contiguous axial images were obtained from the base of the skull
through the vertex without intravenous contrast.

[Series 3: head wo · axial · 0.44mm/px · z∈[-117,+3]mm · 7 of 32 slices shown, 9 images]
[im 4/32  brain]
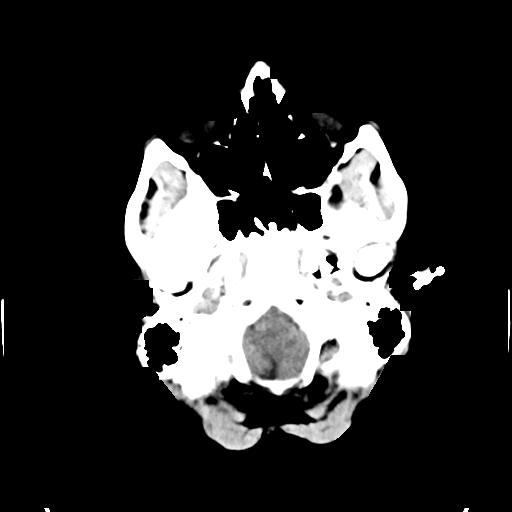
[im 4/32  bone]
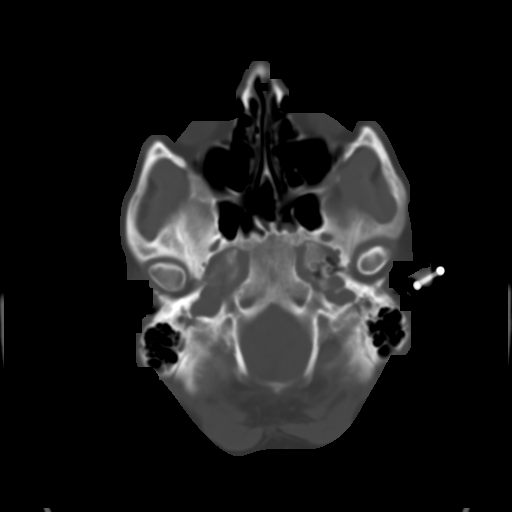
[im 8/32  brain]
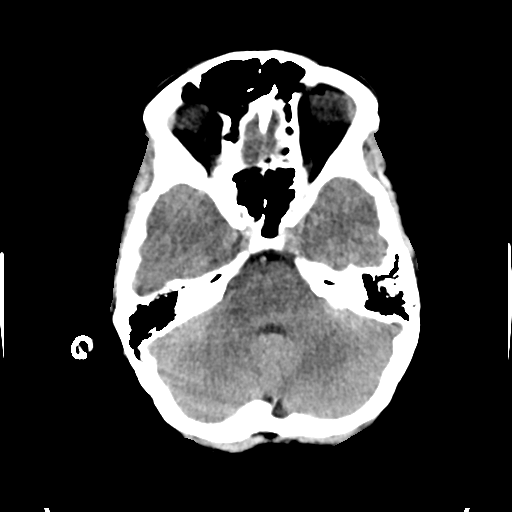
[im 12/32  brain]
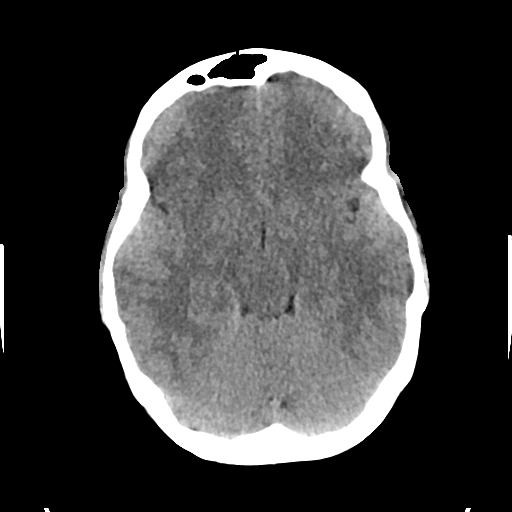
[im 16/32  brain]
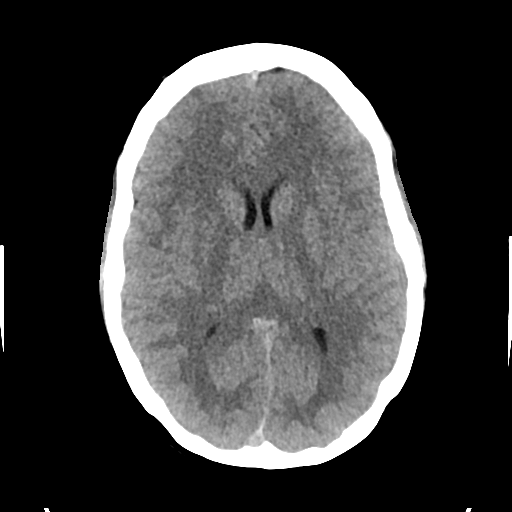
[im 20/32  brain]
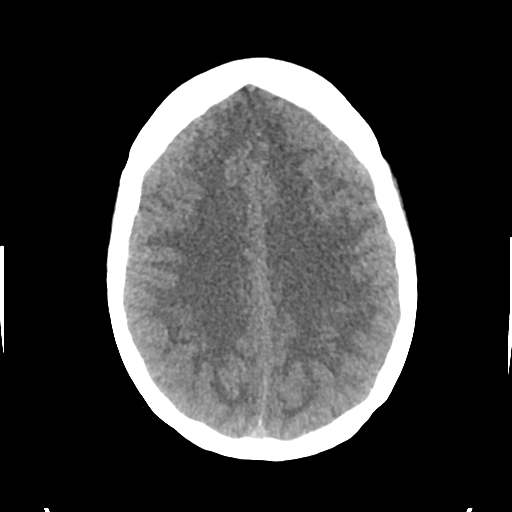
[im 20/32  bone]
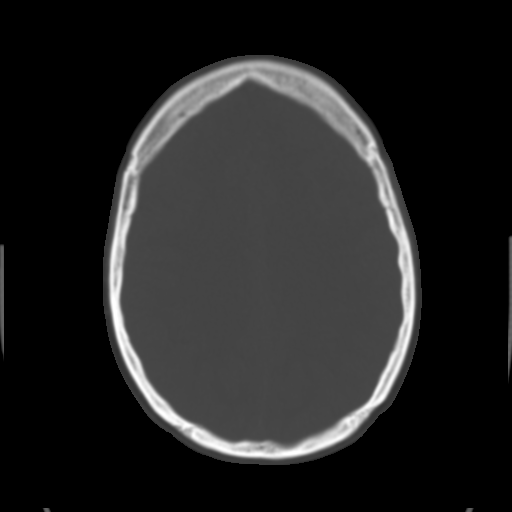
[im 24/32  brain]
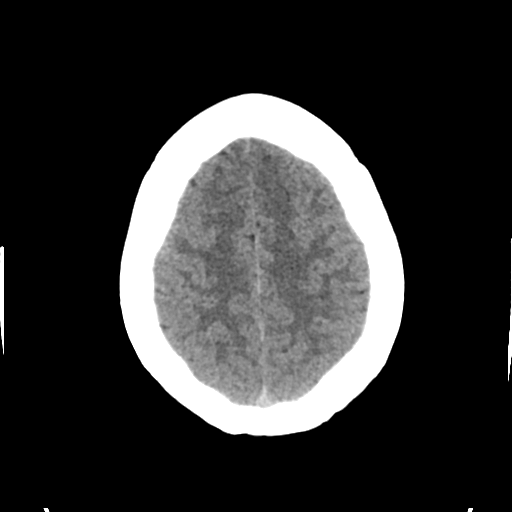
[im 28/32  brain]
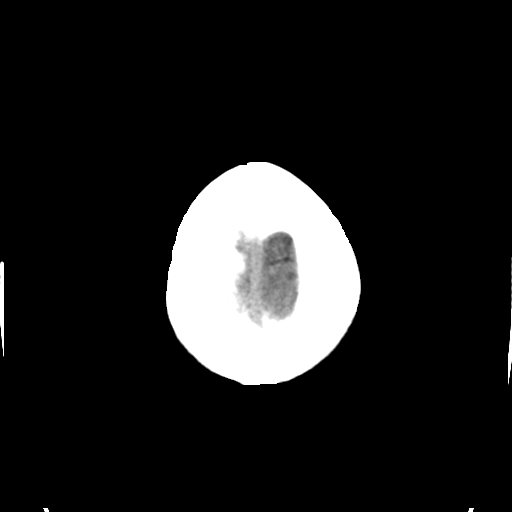

[Series 4: head bone · axial · 0.44mm/px · z∈[-118,-86]mm · 3 of 78 slices shown]
[im 8/78  bone]
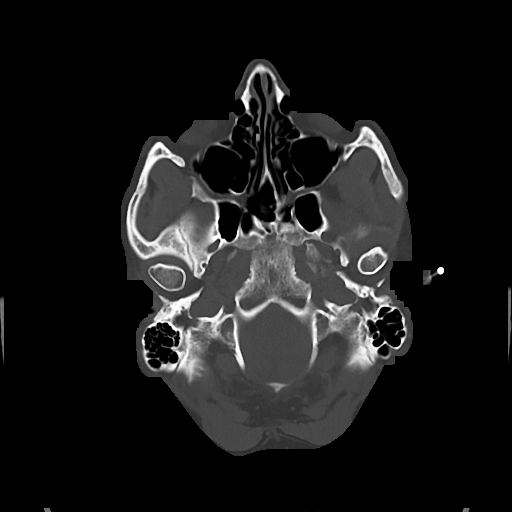
[im 16/78  bone]
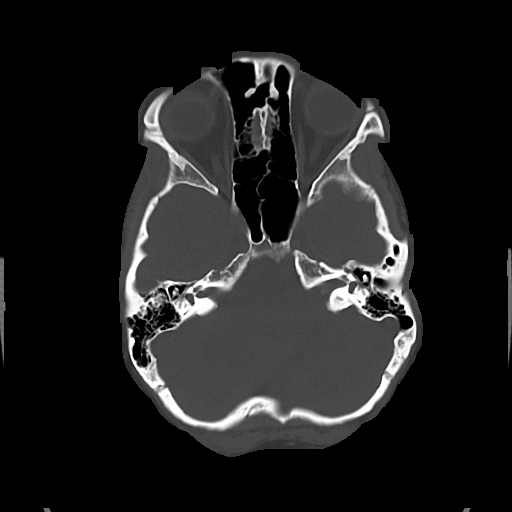
[im 24/78  bone]
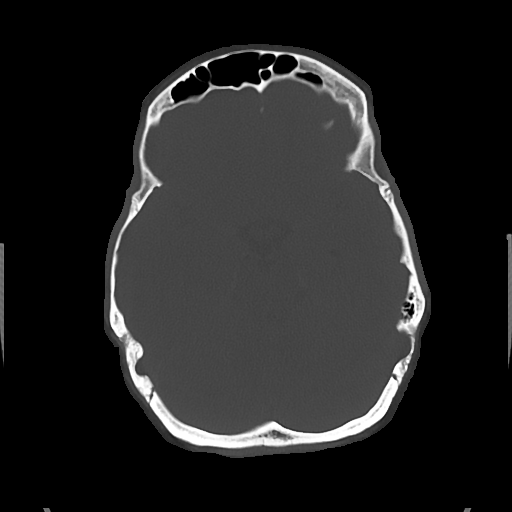

[Series 5: cor soft · coronal · 0.30mm/px · 3 of 67 slices shown]
[im 23/67  brain]
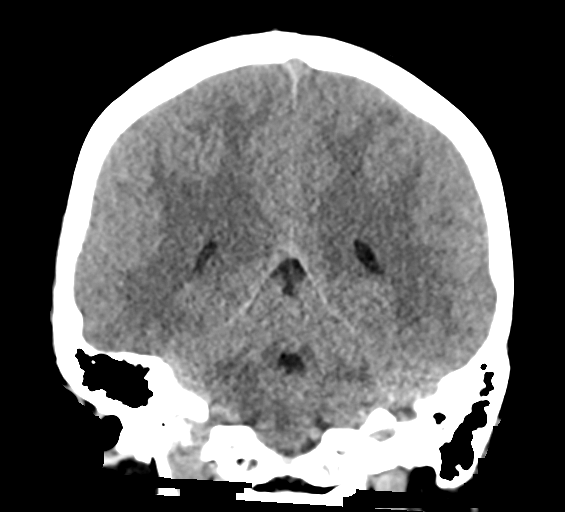
[im 30/67  brain]
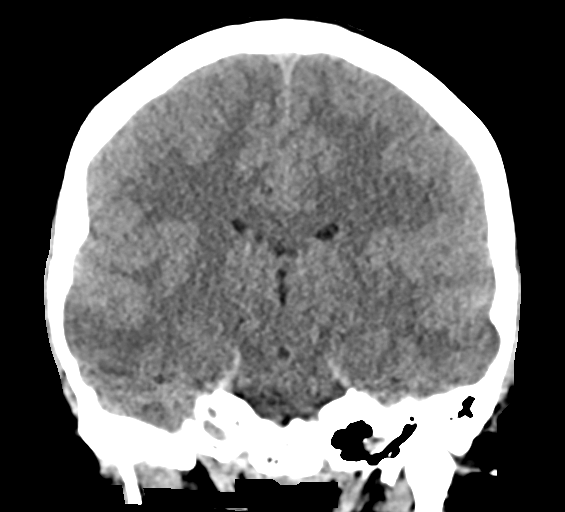
[im 37/67  brain]
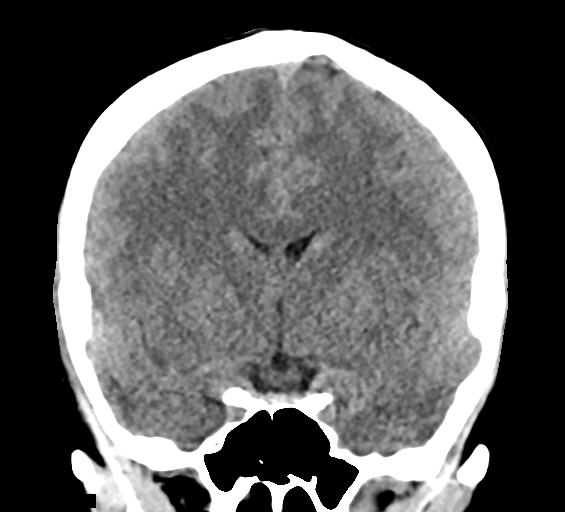

[Series 6: sag soft · sagittal · 0.30mm/px · 3 of 57 slices shown]
[im 19/57  brain]
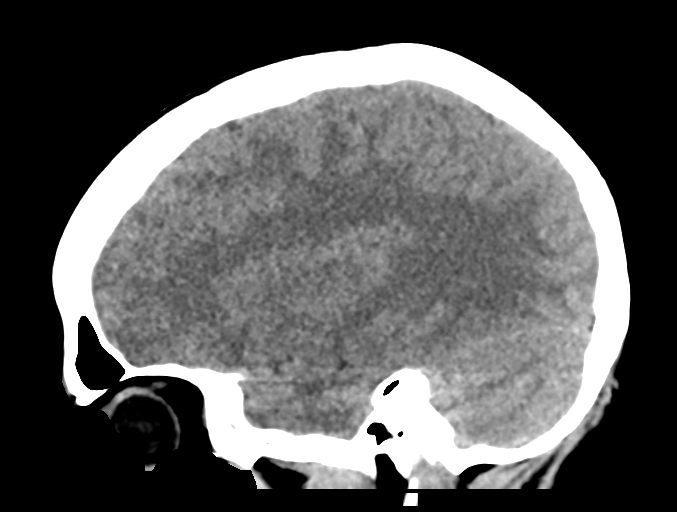
[im 29/57  brain]
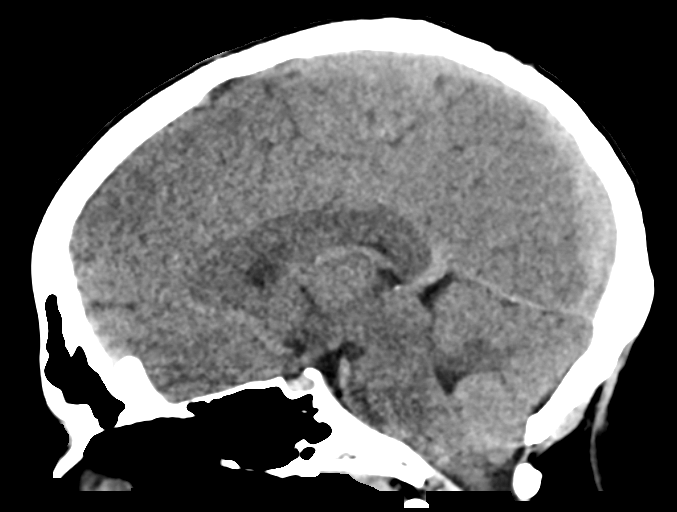
[im 38/57  brain]
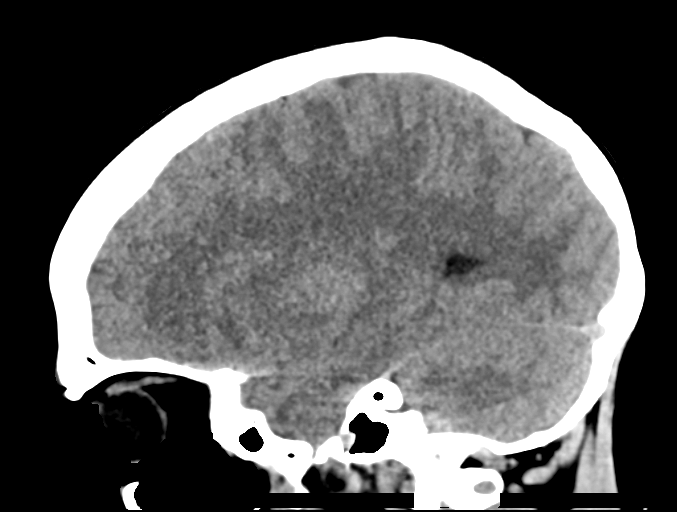

[16 of 47 positions shown; findings below may reference images not displayed]

FINDINGS: Brain: No evidence of acute infarction, hemorrhage, hydrocephalus,
extra-axial collection or mass lesion/mass effect. 3 mm low-lying
cerebellar tonsils normal rounded shape.

Vascular: No hyperdense vessel or unexpected calcification.

Skull: Normal. Negative for fracture or focal lesion.

Sinuses/Orbits: No acute finding.

Other: None.
IMPRESSION: No findings of traumatic brain injury. 3 mm low-lying cerebellar
tonsils. Otherwise unremarkable CT of head.

By: Milan Ekart Luma M.D.

## 2020-04-03 DIAGNOSIS — Z419 Encounter for procedure for purposes other than remedying health state, unspecified: Secondary | ICD-10-CM | POA: Diagnosis not present

## 2020-04-10 DIAGNOSIS — M25511 Pain in right shoulder: Secondary | ICD-10-CM | POA: Diagnosis not present

## 2020-04-10 DIAGNOSIS — M25311 Other instability, right shoulder: Secondary | ICD-10-CM | POA: Diagnosis not present

## 2020-05-04 DIAGNOSIS — Z419 Encounter for procedure for purposes other than remedying health state, unspecified: Secondary | ICD-10-CM | POA: Diagnosis not present

## 2020-06-04 DIAGNOSIS — Z419 Encounter for procedure for purposes other than remedying health state, unspecified: Secondary | ICD-10-CM | POA: Diagnosis not present

## 2020-07-04 DIAGNOSIS — Z419 Encounter for procedure for purposes other than remedying health state, unspecified: Secondary | ICD-10-CM | POA: Diagnosis not present

## 2020-08-04 DIAGNOSIS — Z419 Encounter for procedure for purposes other than remedying health state, unspecified: Secondary | ICD-10-CM | POA: Diagnosis not present

## 2020-09-03 DIAGNOSIS — Z419 Encounter for procedure for purposes other than remedying health state, unspecified: Secondary | ICD-10-CM | POA: Diagnosis not present

## 2020-10-04 DIAGNOSIS — Z419 Encounter for procedure for purposes other than remedying health state, unspecified: Secondary | ICD-10-CM | POA: Diagnosis not present

## 2020-11-04 DIAGNOSIS — Z419 Encounter for procedure for purposes other than remedying health state, unspecified: Secondary | ICD-10-CM | POA: Diagnosis not present

## 2020-12-02 DIAGNOSIS — Z419 Encounter for procedure for purposes other than remedying health state, unspecified: Secondary | ICD-10-CM | POA: Diagnosis not present

## 2021-01-02 DIAGNOSIS — Z419 Encounter for procedure for purposes other than remedying health state, unspecified: Secondary | ICD-10-CM | POA: Diagnosis not present

## 2021-02-01 DIAGNOSIS — Z419 Encounter for procedure for purposes other than remedying health state, unspecified: Secondary | ICD-10-CM | POA: Diagnosis not present

## 2021-02-05 ENCOUNTER — Encounter (INDEPENDENT_AMBULATORY_CARE_PROVIDER_SITE_OTHER): Payer: Self-pay

## 2021-02-18 ENCOUNTER — Ambulatory Visit (INDEPENDENT_AMBULATORY_CARE_PROVIDER_SITE_OTHER): Payer: 59 | Admitting: Pediatrics

## 2021-02-18 ENCOUNTER — Other Ambulatory Visit: Payer: Self-pay

## 2021-02-18 ENCOUNTER — Encounter (INDEPENDENT_AMBULATORY_CARE_PROVIDER_SITE_OTHER): Payer: Self-pay | Admitting: Pediatrics

## 2021-02-18 VITALS — BP 98/74 | HR 68 | Ht 64.5 in | Wt 162.0 lb

## 2021-02-18 DIAGNOSIS — Q048 Other specified congenital malformations of brain: Secondary | ICD-10-CM

## 2021-02-18 DIAGNOSIS — G44229 Chronic tension-type headache, not intractable: Secondary | ICD-10-CM

## 2021-02-18 DIAGNOSIS — G43009 Migraine without aura, not intractable, without status migrainosus: Secondary | ICD-10-CM

## 2021-02-18 NOTE — Patient Instructions (Addendum)
Thank you for coming today.  I am sorry that you are having daily headaches.  I am glad that your migraines are infrequent.  If you have a very severe migraine, the only way that we can treat it because of all your vomiting is to go to the hospital and get IV fluids and medicine in your veins.  There is unfortunately no treatment for your daily headaches.  We need to make certain that your cerebellar tonsillar ectopia has not worsened.  After we perform the MRI scan we will know whether or not you need to have ongoing neurologic care.  I will be here to provide care for you until July 03, 2021 at that point we will switch you to an adult neurologist neurologic care.

## 2021-02-18 NOTE — Progress Notes (Signed)
Patient: Katherine Wagner MRN: [id number] Sex: female DOB: 04-12-1996  Provider: Ellison Carwin, MD Location of Care: [facility] Child [facility]rology  Note type: Routine return visit  History of Present Illness: Referral Source: Porfirio Oar, MD History from: patient and [name] chart Chief Complaint: Migraines  Katherine Wagner is a 25 y.o. female who was evaluated Feb 18, 2021 for the first time since Mar 01, 2018.  He was contacted August 2020 to find out that [facility] was pregnant and had appropriately discontinued Qudexy.  She has a healthy toddler who is 27 months of age.  Interestingly though she had an initial difficult time coming off Qudexy as she is only had 3 migraines in the past year.  These of been associated with severe pain nausea vomiting dizziness and sensitivity to light and sound.  She has chronic daily headaches that are.  Unremitting and do not respond to over-the-counter medication.  She has torn her labrum and was unable to have surgery on a timely basis.  This causes pain in her shoulder which radiates to her neck and to her head.  Whether or not this is in part responsible for her headaches is unclear.  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  Revealed an MRI scan January 25, 2018.  This is embedded in MRI MRA scan.  CT scan of the brain showed grabbing at the base of the brain.  The tonsils are rounded.  Structures were not significantly distorted, ventricles are of normal size.  There is no syrinx.  The MRA scan was also normal although it was affected by dental artifact.  There appeared to be a gap between the distal internal carotid artery at the supraclinoid segment and the middle cerebral artery anterior cerebral artery bifurcation.  This was thought to be artifactual.  All other vessels were normal.  Her general health is good.  Headaches that are bothersome to her for her normal activities.  I told Katherine Wagner that there is no specific treatment for chronic  daily tension headaches.  Her migraines are infrequent enough that I do not think we should restart preventative migraine medication.  I think that it is reasonable for [place] to repeat her MRI scan of the brain without contrast to be certain that there is no change in the tonsillar ectopia.  She has headaches that tend to be more in her neck and occipital region which is the reason that this is necessary.  She stays at home to take care of her daughter.  Boyfriend works outside the home.  They have plans to move into their own home and to get married.  Review of Systems: A complete review of systems was remarkable for patient is here tobe seen for headaches. SHe reports that she has had three migraines in a year, She states that she experiences nausea, vomiting, dizziness, noise sensitivity, and light sensitivity. She also reports that she has headaches everyday. She states that she has radiating pain from her shoulder that moves to her head. She has no other concerns at this time., all other systems reviewed and negative.  Past Medical History Diagnosis Date  . Allergy   . Depression   . GERD (gastroesophageal reflux disease)   . Migraine headache without aura    Dr. Sharene Skeans  . Obesity   . OCD (obsessive compulsive disorder)   . Seizures (HCC)   . Tension headache    episodic   Hospitalizations: No., Head Injury: No., Nervous System Infections:  No., Immunizations up to date: Yes.    Katherine Wagner suffered a concussion in March 2019 while snowboarding without a helmet.   She was seen in the [facility] ED on 12/15/17 around 1 week after the injury for continued issues focusing and intermittent dizziness. Head CT showed no traumatic brain injury, and noted "3 mm low-lying cerebellar tonsils."   As she continued to have symptoms, she followed up with her PCP who obtained MRI/MRA on 01/25/18 which showed "mild cerebellar tonsillar ectopia of 4-5 mm without frank Chiari malformation, " and was otherwise  normal.   Birth History 6 lbs. 9 oz. infant born at full term to a 30 year old gravida 2 para 23 female. Gestation was uncomplicated Labor lasted for 8 hours Normal spontaneous vaginal delivery Nursery course was uncomplicated Growth and development was recalled as normal  Behavior History none  Surgical History Procedure Laterality Date  . NO PAST SURGERIES    . WISDOM TOOTH EXTRACTION     Family History family history includes Migraines in her maternal grandmother and mother. Family history is negative for seizures, intellectual disabilities, blindness, deafness, birth defects, chromosomal disorder, or autism.  Social History Socioeconomic History  . Marital status: Single-boyfriend  . Number of children:  1-month-old daughter  . Years of education:  21  . Highest education level:  High school graduate  . Occupation:     Not currently employed   Tobacco Use  . Smoking status: Never Smoker  . Smokeless tobacco: Never Used  Vaping Use  . Vaping Use: Never used  Substance and Sexual Activity  . Alcohol use: Never  . Drug use: Never  . 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 She graduated from [employer].  She lives with both parents and one sister, 47 yo.  She loves to hang out with friends, go to the beach and take selfies.   No Known Allergies  Physical Exam BP 98/74   Pulse 68   Ht 5' 4.5" (1.638 m)   Wt 162 lb (73.5 kg)   BMI 27.38 kg/m   General: alert, well developed, well nourished, in no acute distress, blond hair, hazel eyes, right handed Head: normocephalic, no dysmorphic features Ears, Nose and Throat: Otoscopic: tympanic membranes normal; pharynx: oropharynx is pink without exudates or tonsillar hypertrophy Neck: supple, full range of motion, no cranial or cervical bruits; mild localized tenderness in her neck Respiratory: auscultation clear Cardiovascular: no murmurs, pulses are normal Musculoskeletal:  no skeletal deformities or apparent scoliosis Skin: no rashes or neurocutaneous lesions  Neurologic Exam  Mental Status: alert; oriented to person, place and year; knowledge is normal for age; language is normal Cranial Nerves: visual fields are full to double simultaneous stimuli; extraocular movements are full and conjugate; pupils are round reactive to light; funduscopic examination shows sharp disc margins with normal vessels; symmetric facial strength; midline tongue and uvula; air conduction is greater than bone conduction bilaterally Motor: Normal strength, tone and mass; good fine motor movements; no pronator drift Sensory: intact responses to cold, vibration, proprioception and stereognosis Coordination: good finger-to-nose, rapid repetitive alternating movements and finger apposition Gait and Station: normal gait and station: patient is able to walk on heels, toes and tandem without difficulty; balance is adequate; Romberg exam is negative; Gower response is negative Reflexes: symmetric and diminished bilaterally; no clonus; bilateral flexor plantar responses  Assessment 1.  Chronic tension-type headache, not intractable, G44.229. 2. Migraine without aura and without status migrainosus, not  intractable, G43.009. 3. 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  Discussion I do not think there is a solution for the chronic daily headaches.  It may be that the pain in her shoulder and neck is causing headaches however she had just had a headache before she injured her labrum.  The frequency of her migraines is such that there is no reason to place her on preventative medicine at this time.  Plan MRI scan of the brain without contrast was ordered for [place] to look at the tonsillar ectopia.  My intent is to refer her to one of the adult neurologist in town prior to my retirement July 03, 2021.  Greater than 50% of a 30-minute visit was spent in counseling and coordination of care  concerning her headaches and their treatment, discussing tonsillar ectopia which I think in this case is a benign situation, and discussing transition of care.  If she has infrequent migraines this could be something that her primary physician can treat.  If her MRI scan remains stable over the past 3 years there is probably no reason to repeat it unless she has new symptoms.    Medication List   Accurate as of Feb 18, 2021 11:59 PM. If you have any questions, ask your nurse or doctor.      TAKE these medications   fluvoxaMINE 100 MG tablet Commonly known as: LUVOX Take 100 mg by mouth at bedtime. What changed: Another medication with the same name was removed. Continue taking this medication, and follow the directions you see here. Changed by: Ellison Carwin, MD     The medication list was reviewed and reconciled. All changes or newly prescribed medications were explained.  A complete medication list was provided to the patient/caregiver.  Deetta Perla MD

## 2021-03-03 ENCOUNTER — Ambulatory Visit
Admission: RE | Admit: 2021-03-03 | Discharge: 2021-03-03 | Disposition: A | Discharge status: Home / Self Care | Payer: 59 | Source: Ambulatory Visit | Attending: Pediatrics | Admitting: Pediatrics

## 2021-03-03 ENCOUNTER — Other Ambulatory Visit: Payer: Self-pay

## 2021-03-03 DIAGNOSIS — G44229 Chronic tension-type headache, not intractable: Secondary | ICD-10-CM

## 2021-03-03 DIAGNOSIS — Q048 Other specified congenital malformations of brain: Secondary | ICD-10-CM

## 2021-03-03 DIAGNOSIS — G43009 Migraine without aura, not intractable, without status migrainosus: Secondary | ICD-10-CM

## 2021-03-04 ENCOUNTER — Telehealth (INDEPENDENT_AMBULATORY_CARE_PROVIDER_SITE_OTHER): Payer: Self-pay | Admitting: Pediatrics

## 2021-03-04 DIAGNOSIS — Z419 Encounter for procedure for purposes other than remedying health state, unspecified: Secondary | ICD-10-CM | POA: Diagnosis not present

## 2021-03-04 NOTE — Telephone Encounter (Signed)
I reviewed the MRI scan from yesterday and it shows tonsillar ectopia 5 mm which is unchanged from 2019.  I called the patient to inform her that there is nothing to do and that we do not need to repeat this.  She had no questions and understood the message.

## 2021-04-03 DIAGNOSIS — Z419 Encounter for procedure for purposes other than remedying health state, unspecified: Secondary | ICD-10-CM | POA: Diagnosis not present

## 2021-05-04 DIAGNOSIS — Z419 Encounter for procedure for purposes other than remedying health state, unspecified: Secondary | ICD-10-CM | POA: Diagnosis not present

## 2021-06-04 DIAGNOSIS — Z419 Encounter for procedure for purposes other than remedying health state, unspecified: Secondary | ICD-10-CM | POA: Diagnosis not present

## 2021-07-04 DIAGNOSIS — Z419 Encounter for procedure for purposes other than remedying health state, unspecified: Secondary | ICD-10-CM | POA: Diagnosis not present

## 2021-08-04 DIAGNOSIS — Z419 Encounter for procedure for purposes other than remedying health state, unspecified: Secondary | ICD-10-CM | POA: Diagnosis not present

## 2021-09-03 DIAGNOSIS — Z419 Encounter for procedure for purposes other than remedying health state, unspecified: Secondary | ICD-10-CM | POA: Diagnosis not present

## 2021-10-04 DIAGNOSIS — Z419 Encounter for procedure for purposes other than remedying health state, unspecified: Secondary | ICD-10-CM | POA: Diagnosis not present

## 2021-11-04 DIAGNOSIS — Z419 Encounter for procedure for purposes other than remedying health state, unspecified: Secondary | ICD-10-CM | POA: Diagnosis not present

## 2021-12-02 DIAGNOSIS — Z419 Encounter for procedure for purposes other than remedying health state, unspecified: Secondary | ICD-10-CM | POA: Diagnosis not present

## 2022-01-02 DIAGNOSIS — Z419 Encounter for procedure for purposes other than remedying health state, unspecified: Secondary | ICD-10-CM | POA: Diagnosis not present

## 2022-02-01 DIAGNOSIS — Z419 Encounter for procedure for purposes other than remedying health state, unspecified: Secondary | ICD-10-CM | POA: Diagnosis not present

## 2022-03-04 DIAGNOSIS — Z419 Encounter for procedure for purposes other than remedying health state, unspecified: Secondary | ICD-10-CM | POA: Diagnosis not present

## 2022-04-03 DIAGNOSIS — Z419 Encounter for procedure for purposes other than remedying health state, unspecified: Secondary | ICD-10-CM | POA: Diagnosis not present

## 2022-05-04 DIAGNOSIS — Z419 Encounter for procedure for purposes other than remedying health state, unspecified: Secondary | ICD-10-CM | POA: Diagnosis not present

## 2022-06-04 DIAGNOSIS — Z419 Encounter for procedure for purposes other than remedying health state, unspecified: Secondary | ICD-10-CM | POA: Diagnosis not present

## 2022-07-04 DIAGNOSIS — Z419 Encounter for procedure for purposes other than remedying health state, unspecified: Secondary | ICD-10-CM | POA: Diagnosis not present

## 2022-08-04 DIAGNOSIS — Z419 Encounter for procedure for purposes other than remedying health state, unspecified: Secondary | ICD-10-CM | POA: Diagnosis not present

## 2022-09-03 DIAGNOSIS — Z419 Encounter for procedure for purposes other than remedying health state, unspecified: Secondary | ICD-10-CM | POA: Diagnosis not present

## 2022-10-04 DIAGNOSIS — Z419 Encounter for procedure for purposes other than remedying health state, unspecified: Secondary | ICD-10-CM | POA: Diagnosis not present

## 2022-11-04 DIAGNOSIS — Z419 Encounter for procedure for purposes other than remedying health state, unspecified: Secondary | ICD-10-CM | POA: Diagnosis not present

## 2022-12-03 DIAGNOSIS — Z419 Encounter for procedure for purposes other than remedying health state, unspecified: Secondary | ICD-10-CM | POA: Diagnosis not present

## 2023-01-03 DIAGNOSIS — Z419 Encounter for procedure for purposes other than remedying health state, unspecified: Secondary | ICD-10-CM | POA: Diagnosis not present

## 2023-02-02 DIAGNOSIS — Z419 Encounter for procedure for purposes other than remedying health state, unspecified: Secondary | ICD-10-CM | POA: Diagnosis not present

## 2023-03-05 DIAGNOSIS — Z419 Encounter for procedure for purposes other than remedying health state, unspecified: Secondary | ICD-10-CM | POA: Diagnosis not present

## 2023-04-04 DIAGNOSIS — Z419 Encounter for procedure for purposes other than remedying health state, unspecified: Secondary | ICD-10-CM | POA: Diagnosis not present

## 2023-05-05 DIAGNOSIS — Z419 Encounter for procedure for purposes other than remedying health state, unspecified: Secondary | ICD-10-CM | POA: Diagnosis not present

## 2023-06-05 DIAGNOSIS — Z419 Encounter for procedure for purposes other than remedying health state, unspecified: Secondary | ICD-10-CM | POA: Diagnosis not present

## 2023-07-05 DIAGNOSIS — Z419 Encounter for procedure for purposes other than remedying health state, unspecified: Secondary | ICD-10-CM | POA: Diagnosis not present

## 2023-08-05 DIAGNOSIS — Z419 Encounter for procedure for purposes other than remedying health state, unspecified: Secondary | ICD-10-CM | POA: Diagnosis not present

## 2023-09-04 DIAGNOSIS — Z419 Encounter for procedure for purposes other than remedying health state, unspecified: Secondary | ICD-10-CM | POA: Diagnosis not present

## 2024-07-09 ENCOUNTER — Ambulatory Visit: Payer: Self-pay | Admitting: Emergency Medicine

## 2024-07-09 ENCOUNTER — Encounter: Payer: Self-pay | Admitting: Emergency Medicine

## 2024-07-09 VITALS — BP 115/64 | HR 67 | Ht 63.0 in | Wt 120.0 lb

## 2024-07-09 DIAGNOSIS — F429 Obsessive-compulsive disorder, unspecified: Secondary | ICD-10-CM

## 2024-07-09 DIAGNOSIS — F909 Attention-deficit hyperactivity disorder, unspecified type: Secondary | ICD-10-CM

## 2024-07-09 MED ORDER — LISDEXAMFETAMINE DIMESYLATE 30 MG PO CAPS: 30.0000 mg | ORAL_CAPSULE | Freq: Every day | ORAL | 0 refills | Status: DC

## 2024-07-09 MED ORDER — LISDEXAMFETAMINE DIMESYLATE 40 MG PO CAPS: 40.0000 mg | ORAL_CAPSULE | ORAL | 0 refills | Status: DC

## 2024-07-09 NOTE — Progress Notes (Addendum)
 [facility] MD/PA/NP Initial Note  07/09/2024 1:06 PM Katherine Wagner  MRN:  [id number]  Chief Complaint:  Chief Complaint   Establish Care; ADHD     HPI:  KatherineCapucine Wagner is a 28 yo F presenting to clinic for new patient psychiatric evaluation and ADHD medication mangement, referred by PCP Leita Blind. She wants to have discussion of potential medication options to help improve her concentration/focus. She has a hx of OCD with onset around ages 5-6, currently managed with Luvox  150 mg daily, which she tolerates well without significant SE. She reports that prior to tx, she engaged in repetitive behaviors, including taking multiple showers and counting objects, as a way to neutralize intrusive thoughts about potential harm coming to following members. She denies intrusive thoughts or compulsive behaviors at this time, dx well controlled over the last two decades with Luvox . Does not want to make any changes to her OCD tx.   Currently, she is concerned about ADHD symptoms. She describes persistent difficulties with attention and focus which is interfering with daily functioning. She reports being easily distracted while driving and during social interactions, leaving home tasks unfinished like cleaning cat litter or folding laundry, feeling restless and fidgety, difficulty unwinding and relaxing at home, frequently misplacing items, and describing her mind as "scattered - all over the place." She notes that these symptoms have gradually worsened over the past 3-4 years.   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 She reports wanting to improve focus and organization to manage both parenting responsibilities, home tasks and work tasks effectively before she starts her job.   She denies SI, HI, or self harm. Denies depression, anxiety, AVH, paranoia, manic symptoms, or delusional thinking. No hx of mania, hypomania or psychosis.  Denies substance abuse other than occasional alcohol consumption. No tobacco or illicit drug use.  Visit Diagnosis:    ICD-10-CM   1. Attention deficit hyperactivity disorder (ADHD), unspecified ADHD type  F90.9 lisdexamfetamine (VYVANSE) 30 MG capsule    2. Obsessive-compulsive disorder, unspecified type  F42.9       Past Psychiatric History:  OCD - taking Luvox  150 mg; tolerating well  Past Medical History:  Past Medical History:  Diagnosis Date   Allergy    Depression    GERD (gastroesophageal reflux disease)    Migraine headache without aura    Dr. Susen   Obesity    OCD (obsessive compulsive disorder)    Seizures (HCC)    Tension headache    episodic    Past Surgical History:  Procedure Laterality Date   NO PAST SURGERIES     WISDOM TOOTH EXTRACTION      Family Psychiatric History: XXX M - depression F - healthy Sister - OCD, ADHD, Anxiety  No fx of suicide, bipolar and schizophrenia   Family History:  Family History  Problem Relation Age of Onset   Depression Mother        Onset young adulthood   Migraines Mother    Healthy Father    OCD Sister    ADD / ADHD Sister    Anxiety disorder Sister    Healthy Brother    Migraines Maternal Grandmother    Colon cancer Neg Hx    Esophageal cancer Neg Hx    Liver cancer Neg Hx    Pancreatic cancer Neg Hx    Rectal cancer Neg Hx    Stomach cancer Neg Hx  Social History: 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   She graduated from [employer].    She lives with fiance and 50 year old daughter   She loves to hang out with friends, go to the beach and take selfies.   Social Drivers of Corporate investment banker Strain: Low Risk  (07/09/2024)   Overall Financial Resource Strain (CARDIA)    Difficulty of Paying Living Expenses: Not hard at all  Food Insecurity: No Food Insecurity (07/09/2024)   Hunger Vital Sign    Worried About Running Out of Food in the Last Year: Never true    Ran Out of Food in the Last Year: Never true  Transportation Needs: No Transportation Needs (07/09/2024)   PRAPARE - Administrator, Civil Service (Medical): No    Lack of Transportation (Non-Medical): No  Physical Activity: Sufficiently Active (07/09/2024)   Exercise Vital Sign    Days of Exercise per Week: 5 days    Minutes of Exercise per Session: 30 min  Stress: Stress Concern Present (07/09/2024)   [employer] of Occupational Health - Occupational Stress Questionnaire    Feeling of Stress: To some extent  Social Connections: Moderately Integrated (07/09/2024)   Social Connection and Isolation Panel    Frequency of Communication with Friends and Family: Once a week    Frequency of Social Gatherings with Friends and Family: Three times a week    Attends Religious Services: 1 to 4 times per year    Active Member of Clubs or Organizations: No    Attends Banker Meetings: Never    Marital Status: Living with partner    Allergies: No Known Allergies  Metabolic Disorder Labs: No results found for: HGBA1C, MPG No results found for: PROLACTIN No results found for: CHOL, TRIG, HDL, CHOLHDL, VLDL, LDLCALC Lab Results  Component Value Date   TSH 2.18 12/29/2017   TSH 1.774  02/05/2012    Therapeutic Level Labs: No results found for: LITHIUM No results found for: VALPROATE No results found for: CBMZ  Current Medications: Current Outpatient Medications  Medication Sig Dispense Refill   fluvoxaMINE  (LUVOX ) 100 MG tablet Take 100 mg by mouth at bedtime.     lisdexamfetamine (VYVANSE) 30 MG capsule Take 1 capsule (30 mg total) by mouth daily. 7 capsule 0   [START ON 07/16/2024] lisdexamfetamine (VYVANSE) 40 MG capsule Take 1 capsule (40 mg total) by mouth every morning. 7 capsule 0   No current facility-administered medications for this visit.    Medication Side Effects: none  Orders placed this visit:  No orders of the defined types were placed in this encounter.   Psychiatric Specialty Exam:  Review of Systems  Psychiatric/Behavioral:         Please refer to HPI  All other systems reviewed and are negative.   Blood pressure 115/64, pulse 67, height 5' 3 (1.6 m), weight 120 lb (54.4 kg), unknown if currently breastfeeding.Body mass index is 21.26 kg/m.  General Appearance: Well-groomed and normal.  Eye Contact:  Good  Speech:  Normal Rate  Volume:  Normal  Mood:  Euthymic  Affect:  Appropriate  Thought Process:  Goal Directed  Orientation:  Full (Time, Place, and Person)  Thought Content: WDL   Suicidal Thoughts:  No  Homicidal Thoughts:  No  Memory:  WNL  Judgement:  Good  Insight:  Good  Psychomotor Activity:  Normal  Concentration:  Concentration: Good  Recall:  Good  Fund of Knowledge: Good  Language: Good  Assets:  Desire for Improvement Financial Resources/Insurance Housing Social Support Talents/Skills Transportation  ADL's:  Intact  Cognition: WNL  Prognosis:  Good   Screenings:  Completed ASRS  PART A: 23/72 PART B: 38/72  Total: 61/72  Receiving Psychotherapy: Therapy 2021; she discontinued because it was managed well with Luvox  150 mg  Treatment Plan/Recommendations:  I provided approximately 60  minutes of face to face time during this encounter, including time spent before and after the visit in records review, medical decision making, counseling pertinent to today's visit, and charting.    Discussed dx and tx plan. Discussed alternative options including therapy.   ADHD -Initiate Vyvanse 30 mg once daily in the AM and increase to 40mg  on week 2, if partial response and tolerable; further titrate by 10-20 mg weekly as needed up to 70 mg/day. -Will start with long-acting to minimize anxiety and better tolerated with comorbid OCD -Monitor SE including BP, HR, sleep, appetite, anxiety, GI upset, HA and OCD symptom changes -Dicussed organizational strategies, planners, time management, and mindfulness techniques to support ADHD symptoms  OCD -Controlled -Continue Luvox  150 mg daily in the evening. Monitor any changes in OCD symptoms and SE. -Encourage and discussed CBT - provided list of counselors will explore therapy options locally or via telehealth.     FU 3-4 weeks   Patient engaged in shared decision-making;treatment plan reviewed and agreed upon.    Shelia Kingsberry, PA-C

## 2024-07-10 ENCOUNTER — Other Ambulatory Visit: Payer: Self-pay

## 2024-07-10 DIAGNOSIS — F909 Attention-deficit hyperactivity disorder, unspecified type: Secondary | ICD-10-CM

## 2024-07-10 MED ORDER — LISDEXAMFETAMINE DIMESYLATE 30 MG PO CAPS: 30.0000 mg | ORAL_CAPSULE | Freq: Every day | ORAL | 0 refills | Status: DC

## 2024-07-10 MED ORDER — LISDEXAMFETAMINE DIMESYLATE 40 MG PO CAPS: 40.0000 mg | ORAL_CAPSULE | ORAL | 0 refills | Status: DC

## 2024-07-10 NOTE — Telephone Encounter (Signed)
 duplicate

## 2024-07-10 NOTE — Addendum Note (Signed)
 Addended by: Amando Chaput on: 07/10/2024 10:54 AM   Modules accepted: Orders

## 2024-07-30 ENCOUNTER — Encounter: Payer: Self-pay | Admitting: Emergency Medicine

## 2024-07-30 ENCOUNTER — Ambulatory Visit (INDEPENDENT_AMBULATORY_CARE_PROVIDER_SITE_OTHER): Admitting: Emergency Medicine

## 2024-07-30 DIAGNOSIS — F909 Attention-deficit hyperactivity disorder, unspecified type: Secondary | ICD-10-CM | POA: Diagnosis not present

## 2024-07-30 DIAGNOSIS — F429 Obsessive-compulsive disorder, unspecified: Secondary | ICD-10-CM

## 2024-07-30 MED ORDER — LISDEXAMFETAMINE DIMESYLATE 30 MG PO CAPS: 30.0000 mg | ORAL_CAPSULE | Freq: Every day | ORAL | 0 refills | Status: DC

## 2024-07-30 NOTE — Progress Notes (Addendum)
 [name] [id number] 1996/07/15 28 y.o.  Subjective:   Patient ID:  Katherine Wagner is a 28 y.o. (DOB 03-Jul-1996) female.  Chief Complaint:  Chief Complaint  Patient presents with   ADHD   Follow-up   HPI Katherine Wagner presents to the office today for follow-up of medication management for ADHD and OCD. LOV - 10/6.  Pt has been overall doing well since last visit. She trialed Vyvanse 30 mg x 7 days, then increased to 40 mg x 7 days, but discontinued the higher dose due to dry mouth and prefers to continue at 30 mg daily. She reports notable improvement in concentration and focus, which has positively impacted her quality of life.  Also, pt reports OCD symptoms are well managed with Luvox ;  However, she has some concerns regarding possible sexual dysfunction attributing to the medication, but is not interested in making meds changes at this time. She would like to have more conversations with husband regarding this before changes.  Denies depression, anxiety, AVH, paranoia, manic symptoms, or delusional thinking. No hx of mania, hypomania or psychosis. Denies substance abuse other than occasional alcohol consumption. No tobacco or illicit drug use.   Denies , SI, HI, or self-harm behaviors. No further complaints at this time.    PHQ2-9    Flowsheet Row Office Visit from 07/09/2024 in [facility] Office Visit from 10/03/2018 in Primary Care at [facility] Visit from 07/25/2017 in Primary Care at [facility] Visit from 06/25/2015 in Primary Care at [facility] Total Score 0 0 0 1     Review of Systems:  Review of Systems  Psychiatric/Behavioral:         Please refer to HPI.  All other systems reviewed and are negative.   Past medications for mental health diagnoses include:  Medications: I have reviewed the patient's current medications.  Current Outpatient Medications  Medication Sig Dispense Refill   fluvoxaMINE  (LUVOX ) 100 MG tablet  Take 100 mg by mouth at bedtime.     lisdexamfetamine (VYVANSE) 30 MG capsule Take 1 capsule (30 mg total) by mouth daily. 30 capsule 0   No current facility-administered medications for this visit.    Medication Side Effects: None  Allergies: No Known Allergies  Past Medical History:  Diagnosis Date   Allergy    Depression    GERD (gastroesophageal reflux disease)    Migraine headache without aura    Dr. Susen   Obesity    OCD (obsessive compulsive disorder)    Seizures (HCC)    Tension headache    episodic    Past Medical History, Surgical history, Social history, and Family history were reviewed and updated as appropriate.   Please see review of systems for further details on the patient's review from today.   Objective:   Physical Exam:  There were no vitals taken for this visit.  Physical Exam Neurological:     Mental Status: She is alert.  Psychiatric:        Attention and Perception: Attention and perception normal.        Mood and Affect: Mood and affect normal.        Speech: Speech normal.        Behavior: Behavior normal. Behavior is cooperative.        Thought Content: Thought content normal.        Cognition and Memory: Cognition and memory normal.        Judgment: Judgment normal.  Lab Review:     Component Value Date/Time   NA 136 11/30/2019 2124   NA 140 07/25/2017 1435   K 3.6 11/30/2019 2124   CL 103 11/30/2019 2124   CO2 23 11/30/2019 2107   GLUCOSE 76 11/30/2019 2124   BUN 4 (L) 11/30/2019 2124   BUN 16 07/25/2017 1435   CREATININE 0.50 11/30/2019 2124   CREATININE 0.51 02/05/2012 1808   CALCIUM 9.0 11/30/2019 2107   PROT 6.3 (L) 11/30/2019 2107   PROT 6.8 07/25/2017 1435   ALBUMIN 3.3 (L) 11/30/2019 2107   ALBUMIN 4.7 07/25/2017 1435   AST 18 11/30/2019 2107   ALT 15 11/30/2019 2107   ALKPHOS 100 11/30/2019 2107   BILITOT 0.6 11/30/2019 2107   BILITOT <0.2 07/25/2017 1435   GFRNONAA >60 11/30/2019 2107   GFRAA >60  11/30/2019 2107       Component Value Date/Time   WBC 11.8 (H) 01/06/2020 0543   RBC 3.33 (L) 01/06/2020 0543   HGB 10.5 (L) 01/06/2020 0543   HCT 31.4 (L) 01/06/2020 0543   PLT 236 01/06/2020 0543   MCV 94.3 01/06/2020 0543   MCV 93.4 07/25/2017 1435   MCH 31.5 01/06/2020 0543   MCHC 33.4 01/06/2020 0543   RDW 12.7 01/06/2020 0543   LYMPHSABS 2.3 11/30/2019 2107   MONOABS 1.0 11/30/2019 2107   EOSABS 0.2 11/30/2019 2107   BASOSABS 0.0 11/30/2019 2107    No results found for: POCLITH, LITHIUM   No results found for: PHENYTOIN, PHENOBARB, VALPROATE, CBMZ   .res Assessment: Plan:    Tanish was seen today for adhd and follow-up.  Diagnoses and all orders for this visit:  Attention deficit hyperactivity disorder (ADHD), unspecified ADHD type -     lisdexamfetamine (VYVANSE) 30 MG capsule; Take 1 capsule (30 mg total) by mouth daily.  Obsessive-compulsive disorder, unspecified type     Please see After Visit Summary for patient specific instructions.  I provided approximately 30 minutes of face to face time during this encounter, including time spent before and after the visit in records review, medical decision making, counseling pertinent to today's visit, and charting.    Discussed dx and tx plan. Discussed alternative options including therapy.    ADHD - controlled PDMP review: low risk trend.  Last refill 10/07 for vyvanse 30 mg x 7 days. 10/16 for vyvanse 40 mg x 7 days. Discontinued 40 mg due to dry mouth. Rx Vyvanse 30 mg daily; pt responded well with this dosage during medication trial and will like to continue with this dosage. Will consider titration if needed. Started with long-acting to minimize anxiety and better tolerated with comorbid OCD  Discussed potential benefits, risks, and side effects of stimulants with patient to include increased heart rate, palpitations, insomnia, increased anxiety, increased irritability, or decreased appetite.   Dicussed organizational strategies, planners, time management, and mindfulness techniques to support ADHD symptoms   OCD - controlled Continue Luvox  150 mg daily AM.  Advised to keep a medication side effect journal to systematically track any symptoms, noting onset, frequency, severity, and context of side effects. Psychotherapy was strongly recommended as important adjunct treatment to medication - provided list of local counselors.    FU - 1 month or sooner if clinically indicated.  - Pt will like to discuss about alternative options of Luvox  due to possible concerns of sexual dysfunction at FU, will like to discuss further with husband.   Patient engaged in shared decision-making;treatment plan reviewed and agreed upon.  Future Appointments  Date Time Provider Department Center  08/27/2024 10:00 AM Vinicius Brockman, [place], PA-C [facility] None    No orders of the defined types were placed in this encounter.

## 2024-08-27 ENCOUNTER — Ambulatory Visit: Admitting: Emergency Medicine

## 2024-08-27 ENCOUNTER — Encounter: Payer: Self-pay | Admitting: Emergency Medicine

## 2024-08-27 DIAGNOSIS — F909 Attention-deficit hyperactivity disorder, unspecified type: Secondary | ICD-10-CM

## 2024-08-27 DIAGNOSIS — F429 Obsessive-compulsive disorder, unspecified: Secondary | ICD-10-CM

## 2024-08-27 MED ORDER — LISDEXAMFETAMINE DIMESYLATE 40 MG PO CAPS: 40.0000 mg | ORAL_CAPSULE | ORAL | 0 refills | Status: DC

## 2024-08-27 NOTE — Progress Notes (Signed)
 Katherine Wagner [id number] Jul 03, 1996 28 y.o.  Subjective:   Patient ID:  Katherine Wagner is a 28 y.o. (DOB [birth year]) female.  Chief Complaint:  Chief Complaint  Patient presents with   Follow-up   ADHD   Other    OCD   HPI [name] presents to the office today for follow-up of medication management for ADHD and OCD.   08/27/2024  Current Medication Regimen. No SE reported. Vyvanse  30 mg po daily Luvox  150 mg po daily  Pt has been doing great since LOV. She reports notable improvement in concentration and focus, which has positively impacted her quality of life. She is able to complete house tasks and be present for her 23 yo daughter. She originally tried Vyvanse  40 mg po daily but discontinued due to dry mouth. However, she will like to try 40 mg because she reports she noticed more improvement in concentration/focus, especially with memory.   Also, pt reports OCD symptoms are well managed with Luvox . She reports experiencing sexual dysfunction but does not make want to make any changes at this time after speaking to husband.   Denies depression, anxiety, AVH, paranoia, manic symptoms, or delusional thinking. No hx of mania, hypomania or psychosis. Denies substance abuse other than occasional alcohol consumption. No tobacco or illicit drug use.   Denies SI, HI, or self-harm behaviors. No further complaints at this time.   07/30/2024  Pt has been overall doing well since LOV. She trialed Vyvanse  30 mg x 7 days, then increased to 40 mg x 7 days, but discontinued the higher dose due to dry mouth and prefers to continue at 30 mg daily. She reports notable improvement in concentration and focus, which has positively impacted her quality of life.  Also, pt reports OCD symptoms are well managed with Luvox ;  However, she has some concerns regarding possible sexual dysfunction attributing to the medication, but is not interested in making meds changes at this time. She would  like to have more conversations with husband regarding this before changes.  Denies depression, anxiety, AVH, paranoia, manic symptoms, or delusional thinking. No hx of mania, hypomania or psychosis. Denies substance abuse other than occasional alcohol consumption. No tobacco or illicit drug use.   Denies SI, HI, or self-harm behaviors. No further complaints at this time.    PHQ2-9    Flowsheet Row Office Visit from 07/09/2024 in [facility] Office Visit from 10/03/2018 in Primary Care at [facility] Visit from 07/25/2017 in Primary Care at [facility] Visit from 06/25/2015 in Primary Care at [facility] Total Score 0 0 0 1     Review of Systems:  Review of Systems  Constitutional:  Positive for fatigue.  Psychiatric/Behavioral:         Please refer to HPI.  All other systems reviewed and are negative.   Past medications for mental health diagnoses include: Vyvanse  40 mg - dry mouth  Medications: I have reviewed the patient's current medications.  Current Outpatient Medications  Medication Sig Dispense Refill   fluvoxaMINE  (LUVOX ) 100 MG tablet Take 100 mg by mouth at bedtime.     lisdexamfetamine (VYVANSE ) 30 MG capsule Take 1 capsule (30 mg total) by mouth daily. 30 capsule 0   No current facility-administered medications for this visit.    Medication Side Effects: None  Allergies: No Known Allergies  Past Medical History:  Diagnosis Date   Allergy    Depression    GERD (gastroesophageal reflux disease)  Migraine headache without aura    Dr. Susen   Obesity    OCD (obsessive compulsive disorder)    Seizures (HCC)    Tension headache    episodic    Past Medical History, Surgical history, Social history, and Family history were reviewed and updated as appropriate.   Please see review of systems for further details on the patient's review from today.   Objective:   Physical Exam:  There were no vitals taken for this  visit.  Physical Exam Neurological:     General: No focal deficit present.     Mental Status: She is alert and oriented to person, place, and time. Mental status is at baseline.  Psychiatric:        Attention and Perception: Attention and perception normal.        Mood and Affect: Mood and affect normal.        Speech: Speech normal.        Behavior: Behavior normal. Behavior is cooperative.        Thought Content: Thought content normal.        Cognition and Memory: Cognition and memory normal.        Judgment: Judgment normal.    Lab Review:     Component Value Date/Time   NA 136 11/30/2019 2124   NA 140 07/25/2017 1435   K 3.6 11/30/2019 2124   CL 103 11/30/2019 2124   CO2 23 11/30/2019 2107   GLUCOSE 76 11/30/2019 2124   BUN 4 (L) 11/30/2019 2124   BUN 16 07/25/2017 1435   CREATININE 0.50 11/30/2019 2124   CREATININE 0.51 02/05/2012 1808   CALCIUM 9.0 11/30/2019 2107   PROT 6.3 (L) 11/30/2019 2107   PROT 6.8 07/25/2017 1435   ALBUMIN 3.3 (L) 11/30/2019 2107   ALBUMIN 4.7 07/25/2017 1435   AST 18 11/30/2019 2107   ALT 15 11/30/2019 2107   ALKPHOS 100 11/30/2019 2107   BILITOT 0.6 11/30/2019 2107   BILITOT <0.2 07/25/2017 1435   GFRNONAA >60 11/30/2019 2107   GFRAA >60 11/30/2019 2107       Component Value Date/Time   WBC 11.8 (H) 01/06/2020 0543   RBC 3.33 (L) 01/06/2020 0543   HGB 10.5 (L) 01/06/2020 0543   HCT 31.4 (L) 01/06/2020 0543   PLT 236 01/06/2020 0543   MCV 94.3 01/06/2020 0543   MCV 93.4 07/25/2017 1435   MCH 31.5 01/06/2020 0543   MCHC 33.4 01/06/2020 0543   RDW 12.7 01/06/2020 0543   LYMPHSABS 2.3 11/30/2019 2107   MONOABS 1.0 11/30/2019 2107   EOSABS 0.2 11/30/2019 2107   BASOSABS 0.0 11/30/2019 2107    No results found for: POCLITH, LITHIUM   No results found for: PHENYTOIN, PHENOBARB, VALPROATE, CBMZ   .res Assessment: Plan:    Jess was seen today for follow-up, adhd and other.  Diagnoses and all orders for this  visit:  Attention deficit hyperactivity disorder (ADHD), unspecified ADHD type  Obsessive-compulsive disorder, unspecified type   Please see After Visit Summary for patient specific instructions.  I provided approximately 30 minutes of face to face time during this encounter, including time spent before and after the visit in records review, medical decision making, counseling pertinent to today's visit, and charting.    Discussed dx and tx plan. Discussed alternative options including therapy.    ADHD - controlled PDMP review: low risk trend.  Last refill 10/27   Increase to Vyvanse  40 mg daily If partial response tolerable; further titrate by  10 to 20 mg weekly as needed up to 70 mg/day. Started with long-acting to minimize anxiety and better tolerated with comorbid OCD  Discussed potential benefits, risks, and side effects of stimulants with patient to include increased heart rate, palpitations, insomnia, increased anxiety, increased irritability, or decreased appetite.  Dicussed organizational strategies, planners, time management, and mindfulness techniques to support ADHD symptoms   OCD - controlled Continue Luvox  150 mg daily AM.  Advised to keep a medication side effect journal to systematically track any symptoms, noting onset, frequency, severity, and context of side effects. Psychotherapy was strongly recommended as important adjunct treatment to medication - provided list of local counselors.    FU - 4 weeks or sooner if clinically indicated.   Patient engaged in shared decision-making;treatment plan reviewed and agreed upon.    Future Appointments  Date Time Provider Department Center  08/27/2024 10:00 AM Machai Desmith, [place], PA-C CP-CP None    No orders of the defined types were placed in this encounter.

## 2024-09-24 ENCOUNTER — Encounter: Payer: Self-pay | Admitting: Emergency Medicine

## 2024-09-24 ENCOUNTER — Ambulatory Visit: Admitting: Emergency Medicine

## 2024-09-24 DIAGNOSIS — F429 Obsessive-compulsive disorder, unspecified: Secondary | ICD-10-CM

## 2024-09-24 DIAGNOSIS — F909 Attention-deficit hyperactivity disorder, unspecified type: Secondary | ICD-10-CM | POA: Diagnosis not present

## 2024-09-24 MED ORDER — LISDEXAMFETAMINE DIMESYLATE 50 MG PO CAPS: 50.0000 mg | ORAL_CAPSULE | Freq: Every day | ORAL | 0 refills | Status: DC

## 2024-09-24 NOTE — Progress Notes (Signed)
 Katherine Wagner [id number] 1996/03/07 28 y.o.  Subjective:   Patient ID:  Katherine Wagner is a 28 y.o. (DOB October 10, 1995) female.  Chief Complaint:  Chief Complaint  Patient presents with   Follow-up   ADHD   Other    OCD   HPI Katherine Wagner presents to the office today with husband for follow-up of medication management for ADHD and OCD.   09/24/2024  Current Medication Regimen. No SE reported. Vyvanse  40 mg po daily Luvox  150 mg po daily  Pt is doing well since LOV. Pt reports improvement in ADHD symptoms with dosage increase to 40 mg, with better task initiation, organization, and cognitive efficiency (decision-making and memory), though she continues to experience some residual difficulty sustaining focus and concentration, particularly during household tasks. Pt is inquiring about increasing dosage.  Collateral information from husband includes noting a clear difference in her behavior on days when medication is missed or when its effects are wearing off mid afternoon, describing her as more scattered, shifting rapidly between activities and leaving multiple task (vacuuming upstairs, cleaning kitchen) partially completed. Patient reports energy and motivation are stable.   OCD symptoms are well controlled with Luvox . She has been taking for the past decade. Anxiety symptoms are well controlled with decreased worry and restlessness; no panic attacks reported. Sleep is adequate and restful. Appetite is stable, with normal weight and intact ADLs and personal hygiene. Ongoing symptom monitoring continues.   Denies SI, HI, or self-harm behaviors. No further complaints at this time. __________________________ 08/27/2024  Current Medication Regimen. No SE reported. Vyvanse  30 mg po daily Luvox  150 mg po daily  Pt has been doing great since LOV. She reports notable improvement in concentration and focus, which has positively impacted her quality of life. She is able to complete  house tasks and be present for her 42 yo daughter. She originally tried Vyvanse  40 mg po daily but discontinued due to dry mouth. However, she will like to try 40 mg because she reports she noticed more improvement in concentration/focus, especially with memory.   Also, pt reports OCD symptoms are well managed with Luvox . She reports experiencing sexual dysfunction but does not make want to make any changes at this time after speaking to husband.   Denies depression, anxiety, AVH, paranoia, manic symptoms, or delusional thinking. No hx of mania, hypomania or psychosis. Denies substance abuse other than occasional alcohol consumption. No tobacco or illicit drug use.   Denies SI, HI, or self-harm behaviors. No further complaints at this time.   07/30/2024  Pt has been overall doing well since LOV. She trialed Vyvanse  30 mg x 7 days, then increased to 40 mg x 7 days, but discontinued the higher dose due to dry mouth and prefers to continue at 30 mg daily. She reports notable improvement in concentration and focus, which has positively impacted her quality of life.  Also, pt reports OCD symptoms are well managed with Luvox ;  However, she has some concerns regarding possible sexual dysfunction attributing to the medication, but is not interested in making meds changes at this time. She would like to have more conversations with husband regarding this before changes.  Denies depression, anxiety, AVH, paranoia, manic symptoms, or delusional thinking. No hx of mania, hypomania or psychosis. Denies substance abuse other than occasional alcohol consumption. No tobacco or illicit drug use.   Denies SI, HI, or self-harm behaviors. No further complaints at this time.    [employer]    Flowsheet Row Office  Visit from 07/09/2024 in [facility] Office Visit from 10/03/2018 in Primary Care at [facility] Visit from 07/25/2017 in Primary Care at [facility] Visit from 06/25/2015 in  Primary Care at [facility] Total Score 0 0 0 1     Review of Systems:  Review of Systems  Psychiatric/Behavioral:         Please refer to HPI.  All other systems reviewed and are negative.   Past medications for mental health diagnoses include: None  Medications: I have reviewed the patient's current medications.  Current Outpatient Medications  Medication Sig Dispense Refill   fluvoxaMINE  (LUVOX ) 100 MG tablet Take 100 mg by mouth at bedtime. (Patient taking differently: Take 150 mg by mouth at bedtime.)     lisdexamfetamine  (VYVANSE ) 50 MG capsule Take 1 capsule (50 mg total) by mouth daily. 30 capsule 0   No current facility-administered medications for this visit.    Medication Side Effects: None  Allergies: No Known Allergies  Past Medical History:  Diagnosis Date   Allergy    Depression    GERD (gastroesophageal reflux disease)    Migraine headache without aura    Dr. Susen   Obesity    OCD (obsessive compulsive disorder)    Seizures (HCC)    Tension headache    episodic    Past Medical History, Surgical history, Social history, and Family history were reviewed and updated as appropriate.   Please see review of systems for further details on the patient's review from today.   Objective:   Physical Exam:  There were no vitals taken for this visit.  Physical Exam Neurological:     General: No focal deficit present.     Mental Status: She is alert and oriented to person, place, and time. Mental status is at baseline.  Psychiatric:        Attention and Perception: Attention and perception normal.        Mood and Affect: Mood and affect normal.        Speech: Speech normal.        Behavior: Behavior normal. Behavior is cooperative.        Thought Content: Thought content normal.        Cognition and Memory: Cognition and memory normal.        Judgment: Judgment normal.    Lab Review:     Component Value Date/Time   NA 136 11/30/2019 2124    NA 140 07/25/2017 1435   K 3.6 11/30/2019 2124   CL 103 11/30/2019 2124   CO2 23 11/30/2019 2107   GLUCOSE 76 11/30/2019 2124   BUN 4 (L) 11/30/2019 2124   BUN 16 07/25/2017 1435   CREATININE 0.50 11/30/2019 2124   CREATININE 0.51 02/05/2012 1808   CALCIUM 9.0 11/30/2019 2107   PROT 6.3 (L) 11/30/2019 2107   PROT 6.8 07/25/2017 1435   ALBUMIN 3.3 (L) 11/30/2019 2107   ALBUMIN 4.7 07/25/2017 1435   AST 18 11/30/2019 2107   ALT 15 11/30/2019 2107   ALKPHOS 100 11/30/2019 2107   BILITOT 0.6 11/30/2019 2107   BILITOT <0.2 07/25/2017 1435   GFRNONAA >60 11/30/2019 2107   GFRAA >60 11/30/2019 2107       Component Value Date/Time   WBC 11.8 (H) 01/06/2020 0543   RBC 3.33 (L) 01/06/2020 0543   HGB 10.5 (L) 01/06/2020 0543   HCT 31.4 (L) 01/06/2020 0543   PLT 236 01/06/2020 0543   MCV 94.3 01/06/2020 0543  MCV 93.4 07/25/2017 1435   MCH 31.5 01/06/2020 0543   MCHC 33.4 01/06/2020 0543   RDW 12.7 01/06/2020 0543   LYMPHSABS 2.3 11/30/2019 2107   MONOABS 1.0 11/30/2019 2107   EOSABS 0.2 11/30/2019 2107   BASOSABS 0.0 11/30/2019 2107    No results found for: POCLITH, LITHIUM   No results found for: PHENYTOIN, PHENOBARB, VALPROATE, CBMZ   .res Assessment: Plan:    There are no diagnoses linked to this encounter.  Please see After Visit Summary for patient specific instructions.  I provided approximately 30 minutes of face to face time during this encounter, including time spent before and after the visit in records review, medical decision making, counseling pertinent to today's visit, and charting.    Discussed dx and tx plan. Discussed alternative options including therapy.    ADHD - controlled PDMP review: low risk trend.  Last refill 11/24  Increase to Vyvanse  50 mg daily If partial response tolerable; further titrate by 10 to 20 mg weekly as needed up to 70 mg/day. Started with long-acting to minimize anxiety and better tolerated with comorbid OCD   Discussed potential benefits, risks, and side effects of stimulants with patient to include increased heart rate, palpitations, insomnia, increased anxiety, increased irritability, or decreased appetite.  Dicussed organizational strategies, planners, time management, and mindfulness techniques to support ADHD symptoms   OCD - controlled Continue Luvox  150 mg daily AM Refilled by PCP Advised to keep a medication side effect journal to systematically track any symptoms, noting onset, frequency, severity, and context of side effects. Psychotherapy was strongly recommended as important adjunct treatment to medication - provided list of local counselors.    FOLLOW UP - 4 weeks or sooner if clinically indicated.   Patient engaged in shared decision-making;treatment plan reviewed and agreed upon.    Future Appointments  Date Time Provider Department Center  10/23/2024 10:30 AM Florina Friends, PA-C [facility] None    No orders of the defined types were placed in this encounter.

## 2024-10-23 ENCOUNTER — Ambulatory Visit: Admitting: Emergency Medicine

## 2024-10-23 ENCOUNTER — Encounter: Payer: Self-pay | Admitting: Emergency Medicine

## 2024-10-23 DIAGNOSIS — F429 Obsessive-compulsive disorder, unspecified: Secondary | ICD-10-CM

## 2024-10-23 DIAGNOSIS — F909 Attention-deficit hyperactivity disorder, unspecified type: Secondary | ICD-10-CM | POA: Diagnosis not present

## 2024-10-23 MED ORDER — LISDEXAMFETAMINE DIMESYLATE 50 MG PO CAPS: 50.0000 mg | ORAL_CAPSULE | Freq: Every day | ORAL | 0 refills | Status: AC

## 2024-10-23 NOTE — Progress Notes (Signed)
 Katherine Wagner [id number] 11/03/1995 29 y.o.  Subjective:   Patient ID:  Katherine Wagner is a 29 y.o. (DOB 02/22/1996) female.  Chief Complaint:  Chief Complaint  Patient presents with   Follow-up   ADHD   HPI Katherine Wagner presents to the office today with husband for follow-up of medication management for ADHD and OCD.   10/23/2024  Current Medication Regimen. No SE reported. Vyvanse  50 mg po daily Luvox  150 mg po daily  Pt is doing well and stable on medication regimen. Pt reports improvement in ADHD symptoms with better task initiation, organization, and cognitive efficiency. Katherine Wagner reports sustained focus and concentration, particularly with household tasks and caring for her 11-year-old daughter. 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  OCD symptoms are well controlled with Luvox . Pt is getting refills from PCP. Katherine Wagner has been taking for the past decade. Anxiety symptoms are well controlled with decreased worry and restlessness; no panic attacks reported. Sleep is adequate and restful. Appetite is stable, with normal weight and intact ADLs and personal hygiene. Ongoing symptom monitoring continues.   Denies SI, HI, or self-harm behaviors. No further complaints at this time. ________________ 09/24/2024  Current Medication Regimen. No SE reported. Vyvanse  40 mg po daily Luvox  150 mg po daily  Pt is doing well since LOV. Pt reports improvement in ADHD symptoms with dosage increase to 40 mg, with better task initiation, organization, and cognitive efficiency (decision-making and memory), though Katherine Wagner continues to experience some residual difficulty sustaining focus and concentration, particularly during household tasks. Pt is inquiring about increasing dosage.  Collateral information from husband includes noting a clear difference in her behavior on days when medication is missed or when its effects are  wearing off mid afternoon, describing her as more scattered, shifting rapidly between activities and leaving multiple task (vacuuming upstairs, cleaning kitchen) partially completed. Patient reports energy and motivation are stable.   OCD symptoms are well controlled with Luvox . Katherine Wagner has been taking for the past decade. Anxiety symptoms are well controlled with decreased worry and restlessness; no panic attacks reported. Sleep is adequate and restful. Appetite is stable, with normal weight and intact ADLs and personal hygiene. Ongoing symptom monitoring continues.   Denies SI, HI, or self-harm behaviors. No further complaints at this time. __________________________ 08/27/2024  Current Medication Regimen. No SE reported. Vyvanse  30 mg po daily Luvox  150 mg po daily  Pt has been doing great since LOV. Katherine Wagner reports notable improvement in concentration and focus, which has positively impacted her quality of life. Katherine Wagner is able to complete house tasks and be present for her 56 yo daughter. Katherine Wagner originally tried Vyvanse  40 mg po daily but discontinued due to dry mouth. However, Katherine Wagner will like to try 40 mg because Katherine Wagner reports Katherine Wagner noticed more improvement in concentration/focus, especially with memory.   Also, pt reports OCD symptoms are well managed with Luvox . Katherine Wagner reports experiencing sexual dysfunction but does not make want to make any changes at this time after speaking to husband.   Denies depression, anxiety, AVH, paranoia, manic symptoms, or delusional thinking. No hx of mania, hypomania or psychosis. Denies substance abuse other than occasional alcohol consumption. No tobacco or illicit drug use.   Denies SI, HI, or self-harm behaviors. No further complaints at this time.   07/30/2024  Pt has been overall doing well since LOV. Katherine Wagner trialed Vyvanse  30 mg x 7 days, then increased to  40 mg x 7 days, but discontinued the higher dose due to dry mouth and prefers to continue at 30 mg daily. Katherine Wagner reports  notable improvement in concentration and focus, which has positively impacted her quality of life.  Also, pt reports OCD symptoms are well managed with Luvox ;  However, Katherine Wagner has some concerns regarding possible sexual dysfunction attributing to the medication, but is not interested in making meds changes at this time. Katherine Wagner would like to have more conversations with husband regarding this before changes.  Denies depression, anxiety, AVH, paranoia, manic symptoms, or delusional thinking. No hx of mania, hypomania or psychosis. Denies substance abuse other than occasional alcohol consumption. No tobacco or illicit drug use.   Denies SI, HI, or self-harm behaviors. No further complaints at this time.    PHQ2-9    Flowsheet Row Office Visit from 07/09/2024 in [facility] Office Visit from 10/03/2018 in Primary Care at [facility] Visit from 07/25/2017 in Primary Care at [facility] Visit from 06/25/2015 in Primary Care at [facility] Total Score 0 0 0 1     Review of Systems:  Review of Systems  Psychiatric/Behavioral:         Please refer to HPI.  All other systems reviewed and are negative.   Past medications for mental health diagnoses include: None  Medications: I have reviewed the patient's current medications.  Current Outpatient Medications  Medication Sig Dispense Refill   fluvoxaMINE  (LUVOX ) 100 MG tablet Take 100 mg by mouth at bedtime. (Patient taking differently: Take 150 mg by mouth at bedtime.)     lisdexamfetamine  (VYVANSE ) 50 MG capsule Take 1 capsule (50 mg total) by mouth daily. 30 capsule 0   No current facility-administered medications for this visit.    Medication Side Effects: None  Allergies: No Known Allergies  Past Medical History:  Diagnosis Date   Allergy    Depression    GERD (gastroesophageal reflux disease)    Migraine headache without aura    Dr. Susen   Obesity    OCD (obsessive compulsive disorder)     Seizures (HCC)    Tension headache    episodic    Past Medical History, Surgical history, Social history, and Family history were reviewed and updated as appropriate.   Please see review of systems for further details on the patient's review from today.   Objective:   Physical Exam:  There were no vitals taken for this visit.  Physical Exam Neurological:     General: No focal deficit present.     Mental Status: Katherine Wagner is alert and oriented to person, place, and time. Mental status is at baseline.  Psychiatric:        Attention and Perception: Attention and perception normal.        Mood and Affect: Mood and affect normal.        Speech: Speech normal.        Behavior: Behavior normal. Behavior is cooperative.        Thought Content: Thought content normal.        Cognition and Memory: Cognition and memory normal.        Judgment: Judgment normal.    Lab Review:     Component Value Date/Time   NA 136 11/30/2019 2124   NA 140 07/25/2017 1435   K 3.6 11/30/2019 2124   CL 103 11/30/2019 2124   CO2 23 11/30/2019 2107   GLUCOSE 76 11/30/2019 2124   BUN 4 (L)  11/30/2019 2124   BUN 16 07/25/2017 1435   CREATININE 0.50 11/30/2019 2124   CREATININE 0.51 02/05/2012 1808   CALCIUM 9.0 11/30/2019 2107   PROT 6.3 (L) 11/30/2019 2107   PROT 6.8 07/25/2017 1435   ALBUMIN 3.3 (L) 11/30/2019 2107   ALBUMIN 4.7 07/25/2017 1435   AST 18 11/30/2019 2107   ALT 15 11/30/2019 2107   ALKPHOS 100 11/30/2019 2107   BILITOT 0.6 11/30/2019 2107   BILITOT <0.2 07/25/2017 1435   GFRNONAA >60 11/30/2019 2107   GFRAA >60 11/30/2019 2107       Component Value Date/Time   WBC 11.8 (H) 01/06/2020 0543   RBC 3.33 (L) 01/06/2020 0543   HGB 10.5 (L) 01/06/2020 0543   HCT 31.4 (L) 01/06/2020 0543   PLT 236 01/06/2020 0543   MCV 94.3 01/06/2020 0543   MCV 93.4 07/25/2017 1435   MCH 31.5 01/06/2020 0543   MCHC 33.4 01/06/2020 0543   RDW 12.7 01/06/2020 0543   LYMPHSABS 2.3 11/30/2019 2107    MONOABS 1.0 11/30/2019 2107   EOSABS 0.2 11/30/2019 2107   BASOSABS 0.0 11/30/2019 2107    No results found for: POCLITH, LITHIUM   No results found for: PHENYTOIN, PHENOBARB, VALPROATE, CBMZ   .res Assessment: Plan:    There are no diagnoses linked to this encounter.  Please see After Visit Summary for patient specific instructions.  I provided approximately 30 minutes of face to face time during this encounter, including time spent before and after the visit in records review, medical decision making, counseling pertinent to today's visit, and charting.    Discussed dx and tx plan. Discussed alternative options including therapy.    ADHD - controlled PDMP review: low risk trend.  Last refill 12/23  Continue Vyvanse  50 mg daily Refilled If partial response tolerable; further titrate by 10 to 20 mg weekly as needed up to 70 mg/day. Started with long-acting to minimize anxiety and better tolerated with comorbid OCD  Discussed potential benefits, risks, and side effects of stimulants with patient to include increased heart rate, palpitations, insomnia, increased anxiety, increased irritability, or decreased appetite.  Dicussed organizational strategies, planners, time management, and mindfulness techniques to support ADHD symptoms   OCD - controlled Continue Luvox  150 mg daily AM Refilled by PCP Advised to keep a medication side effect journal to systematically track any symptoms, noting onset, frequency, severity, and context of side effects. Psychotherapy was strongly recommended as important adjunct treatment to medication - provided list of local counselors.    FOLLOW UP - 4 weeks or sooner if clinically indicated.   Patient engaged in shared decision-making;treatment plan reviewed and agreed upon.    Future Appointments  Date Time Provider Department Center  10/23/2024 10:30 AM Florina Friends, PA-C [facility] None  11/22/2024  8:00 AM Bunn, Marissa S, PA-C  [facility] None    No orders of the defined types were placed in this encounter.

## 2024-10-24 ENCOUNTER — Telehealth: Payer: Self-pay

## 2024-10-25 ENCOUNTER — Other Ambulatory Visit: Payer: Self-pay

## 2024-10-25 DIAGNOSIS — F909 Attention-deficit hyperactivity disorder, unspecified type: Secondary | ICD-10-CM

## 2024-10-25 NOTE — Telephone Encounter (Addendum)
 Prior Authorization Lisdexamfetamine  Dimesylate 50MG  capsules #30/30 [facility]  [place] not required, on preferred list.  Effective Date: 10/25/2024 Authorization Expiration Date: 10/03/2025

## 2024-11-20 ENCOUNTER — Ambulatory Visit: Admitting: Emergency Medicine

## 2024-11-22 ENCOUNTER — Ambulatory Visit
